# Patient Record
Sex: Male | Born: 1935 | Race: White | Hispanic: No | Marital: Married | Smoking: Former smoker
Health system: Southern US, Community
[De-identification: ages and names within clinical notes are randomized; demographics above are authoritative.]

## PROBLEM LIST (undated history)

## (undated) DIAGNOSIS — I82409 Acute embolism and thrombosis of unspecified deep veins of unspecified lower extremity: Secondary | ICD-10-CM

## (undated) DIAGNOSIS — I10 Essential (primary) hypertension: Secondary | ICD-10-CM

## (undated) DIAGNOSIS — E785 Hyperlipidemia, unspecified: Secondary | ICD-10-CM

## (undated) DIAGNOSIS — M1990 Unspecified osteoarthritis, unspecified site: Secondary | ICD-10-CM

## (undated) DIAGNOSIS — E119 Type 2 diabetes mellitus without complications: Secondary | ICD-10-CM

## (undated) DIAGNOSIS — I350 Nonrheumatic aortic (valve) stenosis: Secondary | ICD-10-CM

## (undated) DIAGNOSIS — I2510 Atherosclerotic heart disease of native coronary artery without angina pectoris: Secondary | ICD-10-CM

## (undated) DIAGNOSIS — I255 Ischemic cardiomyopathy: Secondary | ICD-10-CM

## (undated) DIAGNOSIS — I340 Nonrheumatic mitral (valve) insufficiency: Secondary | ICD-10-CM

## (undated) DIAGNOSIS — K219 Gastro-esophageal reflux disease without esophagitis: Secondary | ICD-10-CM

## (undated) HISTORY — DX: Essential (primary) hypertension: I10

## (undated) HISTORY — PX: COSMETIC SURGERY: SHX468

## (undated) HISTORY — PX: LAPAROSCOPIC CHOLECYSTECTOMY: SUR755

## (undated) HISTORY — PX: CORONARY ANGIOPLASTY: SHX604

## (undated) HISTORY — PX: ACHILLES TENDON REPAIR: SUR1153

## (undated) HISTORY — PX: TONSILLECTOMY: SUR1361

## (undated) HISTORY — DX: Hyperlipidemia, unspecified: E78.5

## (undated) HISTORY — PX: CARPAL TUNNEL RELEASE: SHX101

---

## 2001-06-18 ENCOUNTER — Other Ambulatory Visit: Admission: RE | Admit: 2001-06-18 | Discharge: 2001-06-18 | Payer: Self-pay | Admitting: Dermatology

## 2013-06-11 ENCOUNTER — Other Ambulatory Visit (HOSPITAL_COMMUNITY): Payer: Self-pay

## 2013-06-25 ENCOUNTER — Encounter (HOSPITAL_COMMUNITY)
Admission: RE | Admit: 2013-06-25 | Discharge: 2013-06-25 | Disposition: A | Discharge status: Home / Self Care | Payer: Medicare Other | Source: Ambulatory Visit | Attending: Ophthalmology | Admitting: Ophthalmology

## 2013-06-25 ENCOUNTER — Encounter (HOSPITAL_COMMUNITY): Payer: Self-pay

## 2013-06-25 ENCOUNTER — Other Ambulatory Visit: Payer: Self-pay

## 2013-06-25 ENCOUNTER — Encounter (HOSPITAL_COMMUNITY): Payer: Self-pay | Admitting: Pharmacy Technician

## 2013-06-25 DIAGNOSIS — Z01810 Encounter for preprocedural cardiovascular examination: Secondary | ICD-10-CM | POA: Insufficient documentation

## 2013-06-25 DIAGNOSIS — Z01812 Encounter for preprocedural laboratory examination: Secondary | ICD-10-CM | POA: Insufficient documentation

## 2013-06-25 HISTORY — DX: Unspecified osteoarthritis, unspecified site: M19.90

## 2013-06-25 HISTORY — DX: Gastro-esophageal reflux disease without esophagitis: K21.9

## 2013-06-25 LAB — HEMOGLOBIN AND HEMATOCRIT, BLOOD
HCT: 38.1 % — ABNORMAL LOW (ref 39.0–52.0)
Hemoglobin: 13 g/dL (ref 13.0–17.0)

## 2013-06-25 LAB — BASIC METABOLIC PANEL
CO2: 27 mEq/L (ref 19–32)
GFR calc non Af Amer: 33 mL/min — ABNORMAL LOW (ref >90)
Glucose, Bld: 132 mg/dL — ABNORMAL HIGH (ref 70–99)
Potassium: 4.2 mEq/L (ref 3.5–5.1)
Sodium: 140 mEq/L (ref 135–145)

## 2013-06-25 NOTE — Patient Instructions (Addendum)
Your procedure is scheduled on:    Report to [facility] at        AM.  Call this number if you have problems the morning of surgery: [phone]   Remember:   Do not eat or drink   After Midnight.  Take these medicines the morning of surgery with A SIP OF WATER: terazosin, lisinopril   Do not wear jewelry, make-up or nail polish.  Do not wear lotions, powders, or perfumes. You may wear deodorant.  Do not bring valuables to the hospital.  Contacts, dentures or bridgework may not be worn into surgery.     Patients discharged the day of surgery will not be allowed to drive home.  Name and phone number of your driver: driver  Special Instructions: Use eye drops as directed.   Please read over the following fact sheets that you were given: Pain Booklet, Anesthesia Post-op Instructions and Care and Recovery After Surgery    Cataract Surgery  A cataract is a clouding of the lens of the eye. When a lens becomes cloudy, vision is reduced based on the degree and nature of the clouding. Surgery may be needed to improve vision. Surgery removes the cloudy lens and usually replaces it with a substitute lens (intraocular lens, IOL). LET YOUR EYE DOCTOR KNOW ABOUT:  Allergies to food or medicine.   Medicines taken including herbs, eyedrops, over-the-counter medicines, and creams.   Use of steroids (by mouth or creams).   Previous problems with anesthetics or numbing medicine.   History of bleeding problems or blood clots.   Previous surgery.   Other health problems, including diabetes and kidney problems.   Possibility of pregnancy, if this applies.  RISKS AND COMPLICATIONS  Infection.   Inflammation of the eyeball (endophthalmitis) that can spread to both eyes (sympathetic ophthalmia).   Poor wound healing.   If an IOL is inserted, it can later fall out of proper position. This is very uncommon.   Clouding of the part of your eye that holds an IOL in place. This is called an  "after-cataract." These are uncommon, but easily treated.  BEFORE THE PROCEDURE  Do not eat or drink anything except small amounts of water for 8 to 12 before your surgery, or as directed by your caregiver.   Unless you are told otherwise, continue any eyedrops you have been prescribed.   Talk to your primary caregiver about all other medicines that you take (both prescription and non-prescription). In some cases, you may need to stop or change medicines near the time of your surgery. This is most important if you are taking blood-thinning medicine.Do not stop medicines unless you are told to do so.   Arrange for someone to drive you to and from the procedure.   Do not put contact lenses in either eye on the day of your surgery.  PROCEDURE There is more than one method for safely removing a cataract. Your doctor can explain the differences and help determine which is best for you. Phacoemulsification surgery is the most common form of cataract surgery.  An injection is given behind the eye or eyedrops are given to make this a painless procedure.   A small cut (incision) is made on the edge of the clear, dome-shaped surface that covers the front of the eye (cornea).   A tiny probe is painlessly inserted into the eye. This device gives off ultrasound waves that soften and break up the cloudy center of the lens. This makes it  easier for the cloudy lens to be removed by suction.   An IOL may be implanted.   The normal lens of the eye is covered by a clear capsule. Part of that capsule is intentionally left in the eye to support the IOL.   Your surgeon may or may not use stitches to close the incision.  There are other forms of cataract surgery that require a larger incision and stiches to close the eye. This approach is taken in cases where the doctor feels that the cataract cannot be easily removed using phacoemulsification. AFTER THE PROCEDURE  When an IOL is implanted, it does not need  care. It becomes a permanent part of your eye and cannot be seen or felt.   Your doctor will schedule follow-up exams to check on your progress.   Review your other medicines with your doctor to see which can be resumed after surgery.   Use eyedrops or take medicine as prescribed by your doctor.  Document Released: 11/10/2011 Document Reviewed: 11/07/2011 [facility] Patient Information 2012 [place].  PATIENT INSTRUCTIONS POST-ANESTHESIA  IMMEDIATELY FOLLOWING SURGERY:  Do not drive or operate machinery for the first twenty four hours after surgery.  Do not make any important decisions for twenty four hours after surgery or while taking narcotic pain medications or sedatives.  If you develop intractable nausea and vomiting or a severe headache please notify your doctor immediately.  FOLLOW-UP:  Please make an appointment with your surgeon as instructed. You do not need to follow up with anesthesia unless specifically instructed to do so.  WOUND CARE INSTRUCTIONS (if applicable):  Keep a dry clean dressing on the anesthesia/puncture wound site if there is drainage.  Once the wound has quit draining you may leave it open to air.  Generally you should leave the bandage intact for twenty four hours unless there is drainage.  If the epidural site drains for more than 36-48 hours please call the anesthesia department.  QUESTIONS?:  Please feel free to call your physician or the hospital operator if you have any questions, and they will be happy to assist you.

## 2013-06-25 NOTE — Progress Notes (Signed)
06/25/13 1121  OBSTRUCTIVE SLEEP APNEA  Have you ever been diagnosed with sleep apnea through a sleep study? No  Do you snore loudly (loud enough to be heard through closed doors)?  0  Do you often feel tired, fatigued, or sleepy during the daytime? 1  Has anyone observed you stop breathing during your sleep? 0  Do you have, or are you being treated for high blood pressure? 1  BMI more than 35 kg/m2? 0  Age over 77 years old? 1  Neck circumference greater than 40 cm/18 inches? 1  Gender: 1  Obstructive Sleep Apnea Score 5  Score 4 or greater  Results sent to PCP;No PCP

## 2013-06-28 MED ORDER — TETRACAINE HCL 0.5 % OP SOLN
OPHTHALMIC | Status: AC
  Filled 2013-06-28: qty 2

## 2013-06-28 MED ORDER — LIDOCAINE HCL (PF) 1 % IJ SOLN
INTRAMUSCULAR | Status: AC
  Filled 2013-06-28: qty 2

## 2013-06-28 MED ORDER — LIDOCAINE HCL 3.5 % OP GEL
OPHTHALMIC | Status: AC
  Filled 2013-06-28: qty 5

## 2013-06-28 MED ORDER — CYCLOPENTOLATE-PHENYLEPHRINE 0.2-1 % OP SOLN
OPHTHALMIC | Status: AC
  Filled 2013-06-28: qty 2

## 2013-06-28 MED ORDER — NEOMYCIN-POLYMYXIN-DEXAMETH 3.5-10000-0.1 OP OINT
TOPICAL_OINTMENT | OPHTHALMIC | Status: AC
  Filled 2013-06-28: qty 3.5

## 2013-06-28 NOTE — OR Nursing (Signed)
Abnormal ekg reported to Dr. Jayme Cloud.

## 2013-07-01 ENCOUNTER — Ambulatory Visit (HOSPITAL_COMMUNITY)
Admission: RE | Admit: 2013-07-01 | Discharge: 2013-07-01 | Disposition: A | Discharge status: Home / Self Care | Payer: Medicare Other | Source: Ambulatory Visit | Attending: Ophthalmology | Admitting: Ophthalmology

## 2013-07-01 ENCOUNTER — Ambulatory Visit (HOSPITAL_COMMUNITY): Payer: Medicare Other | Admitting: Anesthesiology

## 2013-07-01 ENCOUNTER — Encounter (HOSPITAL_COMMUNITY): Payer: Self-pay | Admitting: Anesthesiology

## 2013-07-01 ENCOUNTER — Encounter (HOSPITAL_COMMUNITY): Payer: Self-pay | Admitting: *Deleted

## 2013-07-01 ENCOUNTER — Encounter (HOSPITAL_COMMUNITY)
Admission: RE | Disposition: A | Discharge status: Home / Self Care | Payer: Self-pay | Source: Ambulatory Visit | Attending: Ophthalmology

## 2013-07-01 DIAGNOSIS — E119 Type 2 diabetes mellitus without complications: Secondary | ICD-10-CM | POA: Insufficient documentation

## 2013-07-01 DIAGNOSIS — I10 Essential (primary) hypertension: Secondary | ICD-10-CM | POA: Insufficient documentation

## 2013-07-01 DIAGNOSIS — H2589 Other age-related cataract: Secondary | ICD-10-CM | POA: Insufficient documentation

## 2013-07-01 HISTORY — PX: CATARACT EXTRACTION W/PHACO: SHX586

## 2013-07-01 LAB — GLUCOSE, CAPILLARY: Glucose-Capillary: 96 mg/dL (ref 70–99)

## 2013-07-01 SURGERY — PHACOEMULSIFICATION, CATARACT, WITH IOL INSERTION
Anesthesia: Monitor Anesthesia Care | Site: Eye | Laterality: Right | Wound class: Clean

## 2013-07-01 MED ORDER — TETRACAINE HCL 0.5 % OP SOLN
1.0000 [drp] | OPHTHALMIC | Status: AC
  Administered 2013-07-01 (×3): 1 [drp] via OPHTHALMIC

## 2013-07-01 MED ORDER — POVIDONE-IODINE 5 % OP SOLN
OPHTHALMIC | Status: DC | PRN
  Administered 2013-07-01: 1 via OPHTHALMIC

## 2013-07-01 MED ORDER — PROVISC 10 MG/ML IO SOLN
INTRAOCULAR | Status: DC | PRN
  Administered 2013-07-01: 8.5 mg via INTRAOCULAR

## 2013-07-01 MED ORDER — LIDOCAINE HCL 3.5 % OP GEL
1.0000 "application " | Freq: Once | OPHTHALMIC | Status: AC
  Administered 2013-07-01: 1 via OPHTHALMIC

## 2013-07-01 MED ORDER — NEOMYCIN-POLYMYXIN-DEXAMETH 0.1 % OP OINT
TOPICAL_OINTMENT | OPHTHALMIC | Status: DC | PRN
  Administered 2013-07-01: 1 via OPHTHALMIC

## 2013-07-01 MED ORDER — MIDAZOLAM HCL 2 MG/2ML IJ SOLN
1.0000 mg | INTRAMUSCULAR | Status: DC | PRN
  Administered 2013-07-01: 2 mg via INTRAVENOUS

## 2013-07-01 MED ORDER — CYCLOPENTOLATE-PHENYLEPHRINE 0.2-1 % OP SOLN
1.0000 [drp] | OPHTHALMIC | Status: AC
  Administered 2013-07-01 (×3): 1 [drp] via OPHTHALMIC

## 2013-07-01 MED ORDER — EPINEPHRINE HCL 1 MG/ML IJ SOLN
INTRAOCULAR | Status: DC | PRN
  Administered 2013-07-01: 08:00:00

## 2013-07-01 MED ORDER — MIDAZOLAM HCL 2 MG/2ML IJ SOLN
INTRAMUSCULAR | Status: AC
  Filled 2013-07-01: qty 2

## 2013-07-01 MED ORDER — LIDOCAINE HCL (PF) 1 % IJ SOLN
INTRAMUSCULAR | Status: DC | PRN
  Administered 2013-07-01: .6 mL

## 2013-07-01 MED ORDER — LACTATED RINGERS IV SOLN
INTRAVENOUS | Status: DC | PRN
  Administered 2013-07-01: 07:00:00 via INTRAVENOUS

## 2013-07-01 MED ORDER — PHENYLEPHRINE HCL 2.5 % OP SOLN
1.0000 [drp] | OPHTHALMIC | Status: AC
  Administered 2013-07-01 (×3): 1 [drp] via OPHTHALMIC

## 2013-07-01 MED ORDER — BSS IO SOLN
INTRAOCULAR | Status: DC | PRN
  Administered 2013-07-01: 15 mL via INTRAOCULAR

## 2013-07-01 MED ORDER — LACTATED RINGERS IV SOLN
INTRAVENOUS | Status: DC
  Administered 2013-07-01: 07:00:00 via INTRAVENOUS

## 2013-07-01 SURGICAL SUPPLY — 32 items

## 2013-07-01 NOTE — Anesthesia Postprocedure Evaluation (Signed)
  Anesthesia Post-op Note  Patient: Tim Hopkins.  Procedure(s) Performed: Procedure(s) with comments: CATARACT EXTRACTION PHACO AND INTRAOCULAR LENS PLACEMENT (IOC) (Right) - CDE 17.35  Patient Location: Short Stay  Anesthesia Type:MAC  Level of Consciousness: awake, alert , oriented and patient cooperative  Airway and Oxygen Therapy: Patient Spontanous Breathing  Post-op Pain: none  Post-op Assessment: Post-op Vital signs reviewed, Patient's Cardiovascular Status Stable, Respiratory Function Stable, Patent Airway and No signs of Nausea or vomiting  Post-op Vital Signs: Reviewed and stable  Complications: No apparent anesthesia complications

## 2013-07-01 NOTE — H&P (Signed)
I have reviewed the H&P, the patient was re-examined, and I have identified no interval changes in medical condition and plan of care since the history and physical of record  

## 2013-07-01 NOTE — Anesthesia Procedure Notes (Signed)
Procedure Name: MAC Date/Time: 07/01/2013 7:32 AM Performed by: Carolyne Littles, Anhar Mcdermott L Pre-anesthesia Checklist: Timeout performed, Patient identified, Emergency Drugs available, Suction available and Patient being monitored Oxygen Delivery Method: Nasal cannula

## 2013-07-01 NOTE — Preoperative (Signed)
Beta Blockers   Reason not to administer Beta Blockers:Not Applicable 

## 2013-07-01 NOTE — Discharge Instructions (Signed)
[name].  07/01/2013     Instructions  1. Use medications as Instructed.  Shake well before use. Wait 5 minutes between drops.  {OPHTHALMIC ANTIBIOTICS:22167} 4 times a day x 1 week.  {OPHTHALMIC ANTI-INFLAMMATORY:22168} 2 times a day x 4 weeks.  {OPHTHALMIC STEROID:22169} 4 times a day - week 1   3 times a day - Week 2, 2 times a day- Week 3, 1 time a day - Week 4.  2. Do not rub the operative eye. Do not swim underwater for 2 weeks.  3. You may remove the clear shield and resume your normal activities the day after  Surgery. Your eyes may feel more comfortable if you wear dark glasses outside.  4. Call our office at [phone] if you have sudden change in vision, extreme redness or pain. Some fluctuation in vision is normal after surgery. If you have an emergency after hours, call Dr. Alto Denver at [phone].  5. It is important that you attend all of your follow-up appointments.             Dr. Alto Denver: [phone]   If you find that you cannot contact your physician, but feel that your signs and   Symptoms warrant a physician's attention, call the Emergency Room at   [phone].

## 2013-07-01 NOTE — Op Note (Signed)
Date of Admission: 07/01/2013  Date of Surgery: 07/01/2013  Pre-Op Dx: Cataract  Right  Eye  Post-Op Dx: Cataract  Right  Eye,  Dx Code 366.19  Surgeon: Gemma Payor, M.D.  Assistants: None  Anesthesia: Topical with MAC  Indications: Painless, progressive loss of vision with compromise of daily activities.  Surgery: Cataract Extraction with Intraocular lens Implant Right Eye  Discription: The patient had dilating drops and viscous lidocaine placed into the left eye in the pre-op holding area. After transfer to the operating room, a time out was performed. The patient was then prepped and draped. Beginning with a 75 degree blade a paracentesis port was made at the surgeon's 2 o'clock position. The anterior chamber was then filled with 1% non-preserved lidocaine. This was followed by filling the anterior chamber with Provisc. A bent cystatome needle was used to create a continuous tear capsulotomy. Hydrodissection was performed with balanced salt solution on a Fine canula. The lens nucleus was then removed using the phacoemulsification handpiece. Residual cortex was removed with the I&A handpiece. The anterior chamber and capsular bag were refilled with Provisc. A posterior chamber intraocular lens was placed into the capsular bag with it's injector. The implant was positioned with the Kuglan hook. The Provisc was then removed from the anterior chamber and capsular bag with the I&A handpiece. Stromal hydration of the main incision and paracentesis port was performed with BSS on a Fine canula. The wounds were tested for leak which was negative. The patient tolerated the procedure well. There were no operative complications. The patient was then transferred to the recovery room in stable condition.  Complications: None  Specimen: None  EBL: None  Prosthetic device: B&L enVista, MX60, power 21.0D, SN [id number].

## 2013-07-01 NOTE — Transfer of Care (Signed)
Immediate Anesthesia Transfer of Care Note  Patient: Tim Hopkins.  Procedure(s) Performed: Procedure(s) with comments: CATARACT EXTRACTION PHACO AND INTRAOCULAR LENS PLACEMENT (IOC) (Right) - CDE 17.35  Patient Location: Short Stay  Anesthesia Type:MAC  Level of Consciousness: awake, alert , oriented and patient cooperative  Airway & Oxygen Therapy: Patient Spontanous Breathing  Post-op Assessment: Report given to PACU RN and Post -op Vital signs reviewed and stable  Post vital signs: Reviewed and stable  Complications: No apparent anesthesia complications

## 2013-07-01 NOTE — Anesthesia Preprocedure Evaluation (Signed)
Anesthesia Evaluation  Patient identified by MRN, date of birth, ID band Patient awake    Reviewed: Allergy & Precautions, H&P , NPO status , Patient's Chart, lab work & pertinent test results  Airway Mallampati: II TM Distance: >3 FB Neck ROM: Full    Dental  (+) Teeth Intact and Poor Dentition   Pulmonary  breath sounds clear to auscultation        Cardiovascular hypertension, Pt. on medications Rhythm:Regular Rate:Normal     Neuro/Psych negative neurological ROS     GI/Hepatic GERD-  Medicated,  Endo/Other  diabetes, Well Controlled, Type 2, Oral Hypoglycemic Agents  Renal/GU      Musculoskeletal   Abdominal   Peds  Hematology   Anesthesia Other Findings   Reproductive/Obstetrics                           Anesthesia Physical Anesthesia Plan  ASA: III  Anesthesia Plan: MAC   Post-op Pain Management:    Induction: Intravenous  Airway Management Planned: Nasal Cannula  Additional Equipment:   Intra-op Plan:   Post-operative Plan:   Informed Consent: I have reviewed the patients History and Physical, chart, labs and discussed the procedure including the risks, benefits and alternatives for the proposed anesthesia with the patient or authorized representative who has indicated his/her understanding and acceptance.     Plan Discussed with:   Anesthesia Plan Comments:         Anesthesia Quick Evaluation

## 2013-07-02 ENCOUNTER — Encounter (HOSPITAL_COMMUNITY): Payer: Self-pay | Admitting: Ophthalmology

## 2013-07-08 ENCOUNTER — Encounter (HOSPITAL_COMMUNITY): Payer: Self-pay | Admitting: Pharmacy Technician

## 2013-07-09 ENCOUNTER — Encounter (HOSPITAL_COMMUNITY)
Admission: RE | Admit: 2013-07-09 | Discharge: 2013-07-09 | Disposition: A | Discharge status: Home / Self Care | Payer: Medicare Other | Source: Ambulatory Visit | Attending: Ophthalmology | Admitting: Ophthalmology

## 2013-07-09 ENCOUNTER — Encounter (HOSPITAL_COMMUNITY): Payer: Self-pay

## 2013-07-09 MED ORDER — ONDANSETRON HCL 4 MG/2ML IJ SOLN: 4.0000 mg | Freq: Once | INTRAMUSCULAR | Status: AC | PRN

## 2013-07-09 MED ORDER — FENTANYL CITRATE 0.05 MG/ML IJ SOLN: 25.0000 ug | INTRAMUSCULAR | Status: DC | PRN

## 2013-07-12 MED ORDER — NEOMYCIN-POLYMYXIN-DEXAMETH 3.5-10000-0.1 OP OINT
TOPICAL_OINTMENT | OPHTHALMIC | Status: AC
  Filled 2013-07-12: qty 3.5

## 2013-07-12 MED ORDER — PHENYLEPHRINE HCL 2.5 % OP SOLN
OPHTHALMIC | Status: AC
  Filled 2013-07-12: qty 2

## 2013-07-12 MED ORDER — CYCLOPENTOLATE-PHENYLEPHRINE 0.2-1 % OP SOLN
OPHTHALMIC | Status: AC
  Filled 2013-07-12: qty 2

## 2013-07-12 MED ORDER — TETRACAINE HCL 0.5 % OP SOLN
OPHTHALMIC | Status: AC
  Filled 2013-07-12: qty 2

## 2013-07-12 MED ORDER — LIDOCAINE HCL 3.5 % OP GEL
OPHTHALMIC | Status: AC
  Filled 2013-07-12: qty 5

## 2013-07-12 MED ORDER — LIDOCAINE HCL (PF) 1 % IJ SOLN
INTRAMUSCULAR | Status: AC
  Filled 2013-07-12: qty 2

## 2013-07-15 ENCOUNTER — Encounter (HOSPITAL_COMMUNITY)
Admission: RE | Disposition: A | Discharge status: Home / Self Care | Payer: Self-pay | Source: Ambulatory Visit | Attending: Ophthalmology

## 2013-07-15 ENCOUNTER — Ambulatory Visit (HOSPITAL_COMMUNITY)
Admission: RE | Admit: 2013-07-15 | Discharge: 2013-07-15 | Disposition: A | Discharge status: Home / Self Care | Payer: Medicare Other | Source: Ambulatory Visit | Attending: Ophthalmology | Admitting: Ophthalmology

## 2013-07-15 ENCOUNTER — Encounter (HOSPITAL_COMMUNITY): Payer: Self-pay | Admitting: Anesthesiology

## 2013-07-15 ENCOUNTER — Encounter (HOSPITAL_COMMUNITY): Payer: Self-pay | Admitting: *Deleted

## 2013-07-15 ENCOUNTER — Ambulatory Visit (HOSPITAL_COMMUNITY): Payer: Medicare Other | Admitting: Anesthesiology

## 2013-07-15 DIAGNOSIS — Z01812 Encounter for preprocedural laboratory examination: Secondary | ICD-10-CM | POA: Insufficient documentation

## 2013-07-15 DIAGNOSIS — I10 Essential (primary) hypertension: Secondary | ICD-10-CM | POA: Insufficient documentation

## 2013-07-15 DIAGNOSIS — E119 Type 2 diabetes mellitus without complications: Secondary | ICD-10-CM | POA: Insufficient documentation

## 2013-07-15 DIAGNOSIS — H2510 Age-related nuclear cataract, unspecified eye: Secondary | ICD-10-CM | POA: Insufficient documentation

## 2013-07-15 HISTORY — PX: CATARACT EXTRACTION W/PHACO: SHX586

## 2013-07-15 LAB — GLUCOSE, CAPILLARY: Glucose-Capillary: 93 mg/dL (ref 70–99)

## 2013-07-15 SURGERY — PHACOEMULSIFICATION, CATARACT, WITH IOL INSERTION
Anesthesia: Monitor Anesthesia Care | Site: Eye | Laterality: Left | Wound class: Clean

## 2013-07-15 MED ORDER — CYCLOPENTOLATE-PHENYLEPHRINE 0.2-1 % OP SOLN
1.0000 [drp] | OPHTHALMIC | Status: AC
Start: 2013-07-15 — End: 2013-07-15
  Administered 2013-07-15 (×3): 1 [drp] via OPHTHALMIC

## 2013-07-15 MED ORDER — POVIDONE-IODINE 5 % OP SOLN
OPHTHALMIC | Status: DC | PRN
  Administered 2013-07-15: 1 via OPHTHALMIC

## 2013-07-15 MED ORDER — BSS IO SOLN
INTRAOCULAR | Status: DC | PRN
  Administered 2013-07-15: 15 mL via INTRAOCULAR

## 2013-07-15 MED ORDER — MIDAZOLAM HCL 2 MG/2ML IJ SOLN
INTRAMUSCULAR | Status: AC
  Filled 2013-07-15: qty 2

## 2013-07-15 MED ORDER — TETRACAINE HCL 0.5 % OP SOLN
1.0000 [drp] | OPHTHALMIC | Status: AC
Start: 2013-07-15 — End: 2013-07-15
  Administered 2013-07-15 (×3): 1 [drp] via OPHTHALMIC

## 2013-07-15 MED ORDER — LIDOCAINE HCL (PF) 1 % IJ SOLN
INTRAMUSCULAR | Status: DC | PRN
  Administered 2013-07-15: .5 mL

## 2013-07-15 MED ORDER — NEOMYCIN-POLYMYXIN-DEXAMETH 0.1 % OP OINT
TOPICAL_OINTMENT | OPHTHALMIC | Status: DC | PRN
  Administered 2013-07-15: 1 via OPHTHALMIC

## 2013-07-15 MED ORDER — PROVISC 10 MG/ML IO SOLN
INTRAOCULAR | Status: DC | PRN
  Administered 2013-07-15: 8.5 mg via INTRAOCULAR

## 2013-07-15 MED ORDER — LACTATED RINGERS IV SOLN
INTRAVENOUS | Status: DC
  Administered 2013-07-15: 1000 mL via INTRAVENOUS

## 2013-07-15 MED ORDER — EPINEPHRINE HCL 1 MG/ML IJ SOLN
INTRAMUSCULAR | Status: AC
  Filled 2013-07-15: qty 1

## 2013-07-15 MED ORDER — EPINEPHRINE HCL 1 MG/ML IJ SOLN
INTRAMUSCULAR | Status: DC | PRN
  Administered 2013-07-15: 09:00:00

## 2013-07-15 MED ORDER — LIDOCAINE HCL 3.5 % OP GEL
1.0000 "application " | Freq: Once | OPHTHALMIC | Status: AC
  Administered 2013-07-15: 1 via OPHTHALMIC

## 2013-07-15 MED ORDER — MIDAZOLAM HCL 2 MG/2ML IJ SOLN
1.0000 mg | INTRAMUSCULAR | Status: DC | PRN
  Administered 2013-07-15: 2 mg via INTRAVENOUS

## 2013-07-15 MED ORDER — PHENYLEPHRINE HCL 2.5 % OP SOLN
1.0000 [drp] | OPHTHALMIC | Status: AC
Start: 2013-07-15 — End: 2013-07-15
  Administered 2013-07-15 (×3): 1 [drp] via OPHTHALMIC

## 2013-07-15 SURGICAL SUPPLY — 32 items

## 2013-07-15 NOTE — Anesthesia Preprocedure Evaluation (Signed)
Anesthesia Evaluation  Patient identified by MRN, date of birth, ID band Patient awake    Reviewed: Allergy & Precautions, H&P , NPO status , Patient's Chart, lab work & pertinent test results  Airway Mallampati: II TM Distance: >3 FB Neck ROM: Full    Dental  (+) Teeth Intact and Poor Dentition   Pulmonary  breath sounds clear to auscultation        Cardiovascular hypertension, Pt. on medications Rhythm:Regular Rate:Normal     Neuro/Psych negative neurological ROS     GI/Hepatic GERD-  Medicated,  Endo/Other  diabetes, Well Controlled, Type 2, Oral Hypoglycemic Agents  Renal/GU      Musculoskeletal   Abdominal   Peds  Hematology   Anesthesia Other Findings   Reproductive/Obstetrics                           Anesthesia Physical Anesthesia Plan  ASA: III  Anesthesia Plan: MAC   Post-op Pain Management:    Induction: Intravenous  Airway Management Planned: Nasal Cannula  Additional Equipment:   Intra-op Plan:   Post-operative Plan:   Informed Consent: I have reviewed the patients History and Physical, chart, labs and discussed the procedure including the risks, benefits and alternatives for the proposed anesthesia with the patient or authorized representative who has indicated his/her understanding and acceptance.     Plan Discussed with:   Anesthesia Plan Comments:         Anesthesia Quick Evaluation  

## 2013-07-15 NOTE — Transfer of Care (Signed)
Immediate Anesthesia Transfer of Care Note  Patient: Tim Hopkins.  Procedure(s) Performed: Procedure(s) with comments: CATARACT EXTRACTION PHACO AND INTRAOCULAR LENS PLACEMENT (IOC) (Left) - CDE:12.58  Patient Location: Short Stay  Anesthesia Type:MAC  Level of Consciousness: awake, alert , oriented and patient cooperative  Airway & Oxygen Therapy: Patient Spontanous Breathing  Post-op Assessment: Report given to PACU RN, Post -op Vital signs reviewed and stable and Patient moving all extremities  Post vital signs: Reviewed and stable  Complications: No apparent anesthesia complications

## 2013-07-15 NOTE — Op Note (Signed)
Date of Admission: 07/15/2013  Date of Surgery: 07/15/2013  Pre-Op Dx: Cataract  Left  Eye  Post-Op Dx: Cataract  Left  Eye,  Dx Code 366.16  Surgeon: Gemma Payor, M.D.  Assistants: None  Anesthesia: Topical with MAC  Indications: Painless, progressive loss of vision with compromise of daily activities.  Surgery: Cataract Extraction with Intraocular lens Implant Left Eye  Discription: The patient had dilating drops and viscous lidocaine placed into the left eye in the pre-op holding area. After transfer to the operating room, a time out was performed. The patient was then prepped and draped. Beginning with a 75 degree blade a paracentesis port was made at the surgeon's 2 o'clock position. The anterior chamber was then filled with 1% non-preserved lidocaine. This was followed by filling the anterior chamber with Provisc. A bent cystatome needle was used to create a continuous tear capsulotomy. Hydrodissection was performed with balanced salt solution on a Fine canula. The lens nucleus was then removed using the phacoemulsification handpiece. Residual cortex was removed with the I&A handpiece. The anterior chamber and capsular bag were refilled with Provisc. A posterior chamber intraocular lens was placed into the capsular bag with it's injector. The implant was positioned with the Kuglan hook. The Provisc was then removed from the anterior chamber and capsular bag with the I&A handpiece. Stromal hydration of the main incision and paracentesis port was performed with BSS on a Fine canula. The wounds were tested for leak which was negative. The patient tolerated the procedure well. There were no operative complications. The patient was then transferred to the recovery room in stable condition.  Complications: None  Specimen: None  EBL: None  Prosthetic device: B&L enVista, MX60, power 20.0D, SN [id number].

## 2013-07-15 NOTE — H&P (Signed)
I have reviewed the H&P, the patient was re-examined, and I have identified no interval changes in medical condition and plan of care since the history and physical of record  

## 2013-07-15 NOTE — Anesthesia Postprocedure Evaluation (Signed)
  Anesthesia Post-op Note  Patient: Tim Hopkins.  Procedure(s) Performed: Procedure(s) with comments: CATARACT EXTRACTION PHACO AND INTRAOCULAR LENS PLACEMENT (IOC) (Left) - CDE:12.58  Patient Location: Short Stay  Anesthesia Type:MAC  Level of Consciousness: awake, alert , oriented and patient cooperative  Airway and Oxygen Therapy: Patient Spontanous Breathing  Post-op Pain: none  Post-op Assessment: Post-op Vital signs reviewed, Patient's Cardiovascular Status Stable, Respiratory Function Stable, Patent Airway and Pain level controlled  Post-op Vital Signs: Reviewed and stable  Complications: No apparent anesthesia complications

## 2013-07-18 ENCOUNTER — Encounter (HOSPITAL_COMMUNITY): Payer: Self-pay | Admitting: Ophthalmology

## 2016-08-19 ENCOUNTER — Inpatient Hospital Stay (HOSPITAL_COMMUNITY)
Admission: EM | Admit: 2016-08-19 | Discharge: 2016-08-30 | DRG: 216 | Disposition: A | Discharge status: Skilled Nursing Facility | Payer: Medicare Other | Attending: Internal Medicine | Admitting: Internal Medicine

## 2016-08-19 ENCOUNTER — Emergency Department (HOSPITAL_COMMUNITY): Payer: Medicare Other

## 2016-08-19 ENCOUNTER — Encounter (HOSPITAL_COMMUNITY): Payer: Self-pay | Admitting: *Deleted

## 2016-08-19 DIAGNOSIS — Z9841 Cataract extraction status, right eye: Secondary | ICD-10-CM

## 2016-08-19 DIAGNOSIS — J90 Pleural effusion, not elsewhere classified: Secondary | ICD-10-CM | POA: Insufficient documentation

## 2016-08-19 DIAGNOSIS — Z79899 Other long term (current) drug therapy: Secondary | ICD-10-CM | POA: Diagnosis not present

## 2016-08-19 DIAGNOSIS — R7989 Other specified abnormal findings of blood chemistry: Secondary | ICD-10-CM

## 2016-08-19 DIAGNOSIS — I509 Heart failure, unspecified: Secondary | ICD-10-CM

## 2016-08-19 DIAGNOSIS — I82492 Acute embolism and thrombosis of other specified deep vein of left lower extremity: Secondary | ICD-10-CM | POA: Diagnosis not present

## 2016-08-19 DIAGNOSIS — I25110 Atherosclerotic heart disease of native coronary artery with unstable angina pectoris: Secondary | ICD-10-CM | POA: Diagnosis not present

## 2016-08-19 DIAGNOSIS — E1122 Type 2 diabetes mellitus with diabetic chronic kidney disease: Secondary | ICD-10-CM | POA: Diagnosis present

## 2016-08-19 DIAGNOSIS — I209 Angina pectoris, unspecified: Secondary | ICD-10-CM | POA: Diagnosis not present

## 2016-08-19 DIAGNOSIS — E119 Type 2 diabetes mellitus without complications: Secondary | ICD-10-CM | POA: Diagnosis present

## 2016-08-19 DIAGNOSIS — M1990 Unspecified osteoarthritis, unspecified site: Secondary | ICD-10-CM | POA: Diagnosis not present

## 2016-08-19 DIAGNOSIS — Z9842 Cataract extraction status, left eye: Secondary | ICD-10-CM

## 2016-08-19 DIAGNOSIS — R0789 Other chest pain: Secondary | ICD-10-CM | POA: Diagnosis not present

## 2016-08-19 DIAGNOSIS — K219 Gastro-esophageal reflux disease without esophagitis: Secondary | ICD-10-CM | POA: Diagnosis present

## 2016-08-19 DIAGNOSIS — I5021 Acute systolic (congestive) heart failure: Secondary | ICD-10-CM | POA: Diagnosis present

## 2016-08-19 DIAGNOSIS — M79605 Pain in left leg: Secondary | ICD-10-CM | POA: Diagnosis not present

## 2016-08-19 DIAGNOSIS — D649 Anemia, unspecified: Secondary | ICD-10-CM | POA: Diagnosis present

## 2016-08-19 DIAGNOSIS — R072 Precordial pain: Secondary | ICD-10-CM | POA: Diagnosis not present

## 2016-08-19 DIAGNOSIS — I255 Ischemic cardiomyopathy: Secondary | ICD-10-CM | POA: Diagnosis present

## 2016-08-19 DIAGNOSIS — N289 Disorder of kidney and ureter, unspecified: Secondary | ICD-10-CM

## 2016-08-19 DIAGNOSIS — Z86718 Personal history of other venous thrombosis and embolism: Secondary | ICD-10-CM

## 2016-08-19 DIAGNOSIS — I130 Hypertensive heart and chronic kidney disease with heart failure and stage 1 through stage 4 chronic kidney disease, or unspecified chronic kidney disease: Secondary | ICD-10-CM | POA: Diagnosis present

## 2016-08-19 DIAGNOSIS — Z87891 Personal history of nicotine dependence: Secondary | ICD-10-CM

## 2016-08-19 DIAGNOSIS — N183 Chronic kidney disease, stage 3 unspecified: Secondary | ICD-10-CM | POA: Diagnosis present

## 2016-08-19 DIAGNOSIS — I2510 Atherosclerotic heart disease of native coronary artery without angina pectoris: Secondary | ICD-10-CM

## 2016-08-19 DIAGNOSIS — R079 Chest pain, unspecified: Secondary | ICD-10-CM | POA: Diagnosis present

## 2016-08-19 DIAGNOSIS — Z961 Presence of intraocular lens: Secondary | ICD-10-CM | POA: Diagnosis present

## 2016-08-19 DIAGNOSIS — M79662 Pain in left lower leg: Secondary | ICD-10-CM | POA: Diagnosis not present

## 2016-08-19 DIAGNOSIS — I3139 Other pericardial effusion (noninflammatory): Secondary | ICD-10-CM

## 2016-08-19 DIAGNOSIS — I350 Nonrheumatic aortic (valve) stenosis: Secondary | ICD-10-CM | POA: Diagnosis not present

## 2016-08-19 DIAGNOSIS — R778 Other specified abnormalities of plasma proteins: Secondary | ICD-10-CM

## 2016-08-19 DIAGNOSIS — M79609 Pain in unspecified limb: Secondary | ICD-10-CM | POA: Diagnosis not present

## 2016-08-19 DIAGNOSIS — I214 Non-ST elevation (NSTEMI) myocardial infarction: Secondary | ICD-10-CM | POA: Diagnosis present

## 2016-08-19 DIAGNOSIS — R570 Cardiogenic shock: Secondary | ICD-10-CM | POA: Diagnosis not present

## 2016-08-19 DIAGNOSIS — R319 Hematuria, unspecified: Secondary | ICD-10-CM | POA: Diagnosis not present

## 2016-08-19 DIAGNOSIS — Z7982 Long term (current) use of aspirin: Secondary | ICD-10-CM

## 2016-08-19 DIAGNOSIS — I82402 Acute embolism and thrombosis of unspecified deep veins of left lower extremity: Secondary | ICD-10-CM | POA: Diagnosis not present

## 2016-08-19 DIAGNOSIS — I10 Essential (primary) hypertension: Secondary | ICD-10-CM | POA: Diagnosis present

## 2016-08-19 DIAGNOSIS — E876 Hypokalemia: Secondary | ICD-10-CM | POA: Diagnosis not present

## 2016-08-19 DIAGNOSIS — Z9889 Other specified postprocedural states: Secondary | ICD-10-CM

## 2016-08-19 DIAGNOSIS — I313 Pericardial effusion (noninflammatory): Secondary | ICD-10-CM

## 2016-08-19 DIAGNOSIS — M7989 Other specified soft tissue disorders: Secondary | ICD-10-CM | POA: Diagnosis not present

## 2016-08-19 LAB — URINALYSIS, ROUTINE W REFLEX MICROSCOPIC
Bilirubin Urine: NEGATIVE
Glucose, UA: NEGATIVE mg/dL
Ketones, ur: NEGATIVE mg/dL
LEUKOCYTES UA: NEGATIVE
Nitrite: NEGATIVE
PROTEIN: NEGATIVE mg/dL
Specific Gravity, Urine: 1.02 (ref 1.005–1.030)
pH: 5.5 (ref 5.0–8.0)

## 2016-08-19 LAB — COMPREHENSIVE METABOLIC PANEL
ALT: 11 U/L — AB (ref 17–63)
ANION GAP: 8 (ref 5–15)
AST: 19 U/L (ref 15–41)
Albumin: 3.9 g/dL (ref 3.5–5.0)
Alkaline Phosphatase: 41 U/L (ref 38–126)
BUN: 22 mg/dL — ABNORMAL HIGH (ref 6–20)
CALCIUM: 9.2 mg/dL (ref 8.9–10.3)
CHLORIDE: 107 mmol/L (ref 101–111)
CO2: 27 mmol/L (ref 22–32)
CREATININE: 1.53 mg/dL — AB (ref 0.61–1.24)
GFR, EST AFRICAN AMERICAN: 46 mL/min — AB (ref >60)
GFR, EST NON AFRICAN AMERICAN: 39 mL/min — AB (ref >60)
Glucose, Bld: 117 mg/dL — ABNORMAL HIGH (ref 65–99)
Potassium: 4.3 mmol/L (ref 3.5–5.1)
Sodium: 142 mmol/L (ref 135–145)
Total Bilirubin: 0.7 mg/dL (ref 0.3–1.2)
Total Protein: 6.8 g/dL (ref 6.5–8.1)

## 2016-08-19 LAB — LIPASE, BLOOD: LIPASE: 29 U/L (ref 11–51)

## 2016-08-19 LAB — TROPONIN I
Troponin I: 0.72 ng/mL (ref <0.03)
Troponin I: 0.78 ng/mL (ref <0.03)
Troponin I: 0.81 ng/mL (ref <0.03)

## 2016-08-19 LAB — CBC
HCT: 36.8 % — ABNORMAL LOW (ref 39.0–52.0)
HEMOGLOBIN: 12.2 g/dL — AB (ref 13.0–17.0)
MCH: 30.5 pg (ref 26.0–34.0)
MCHC: 33.2 g/dL (ref 30.0–36.0)
MCV: 92 fL (ref 78.0–100.0)
PLATELETS: 162 10*3/uL (ref 150–400)
RBC: 4 MIL/uL — AB (ref 4.22–5.81)
RDW: 14 % (ref 11.5–15.5)
WBC: 7.1 10*3/uL (ref 4.0–10.5)

## 2016-08-19 LAB — URINE MICROSCOPIC-ADD ON
BACTERIA UA: NONE SEEN
SQUAMOUS EPITHELIAL / LPF: NONE SEEN
WBC UA: NONE SEEN WBC/hpf (ref 0–5)

## 2016-08-19 LAB — BRAIN NATRIURETIC PEPTIDE: B NATRIURETIC PEPTIDE 5: 737 pg/mL — AB (ref 0.0–100.0)

## 2016-08-19 MED ORDER — PANTOPRAZOLE SODIUM 40 MG PO TBEC
40.0000 mg | DELAYED_RELEASE_TABLET | Freq: Every day | ORAL | Status: DC
  Administered 2016-08-20 – 2016-08-30 (×12): 40 mg via ORAL
  Filled 2016-08-19 (×12): qty 1

## 2016-08-19 MED ORDER — ATORVASTATIN CALCIUM 40 MG PO TABS
40.0000 mg | ORAL_TABLET | Freq: Every day | ORAL | Status: DC
  Administered 2016-08-20 – 2016-08-29 (×10): 40 mg via ORAL
  Filled 2016-08-19 (×10): qty 1

## 2016-08-19 MED ORDER — FINASTERIDE 5 MG PO TABS
5.0000 mg | ORAL_TABLET | Freq: Every day | ORAL | Status: DC
  Administered 2016-08-20 – 2016-08-30 (×12): 5 mg via ORAL
  Filled 2016-08-19 (×12): qty 1

## 2016-08-19 MED ORDER — HEPARIN (PORCINE) IN NACL 100-0.45 UNIT/ML-% IJ SOLN
1350.0000 [IU]/h | INTRAMUSCULAR | Status: DC
  Filled 2016-08-19: qty 250

## 2016-08-19 MED ORDER — METOPROLOL TARTRATE 25 MG PO TABS
25.0000 mg | ORAL_TABLET | Freq: Two times a day (BID) | ORAL | Status: DC
  Administered 2016-08-20 – 2016-08-30 (×21): 25 mg via ORAL
  Filled 2016-08-19 (×22): qty 1

## 2016-08-19 MED ORDER — FUROSEMIDE 20 MG PO TABS
20.0000 mg | ORAL_TABLET | Freq: Every day | ORAL | Status: DC
  Administered 2016-08-20: 20 mg via ORAL
  Filled 2016-08-19: qty 1

## 2016-08-19 MED ORDER — ONDANSETRON HCL 4 MG/2ML IJ SOLN: 4.0000 mg | Freq: Four times a day (QID) | INTRAMUSCULAR | Status: DC | PRN

## 2016-08-19 MED ORDER — TERAZOSIN HCL 5 MG PO CAPS
5.0000 mg | ORAL_CAPSULE | Freq: Every day | ORAL | Status: DC
  Administered 2016-08-19 – 2016-08-30 (×11): 5 mg via ORAL
  Filled 2016-08-19 (×12): qty 1

## 2016-08-19 MED ORDER — ASPIRIN EC 325 MG PO TBEC: 325.0000 mg | DELAYED_RELEASE_TABLET | Freq: Every day | ORAL | Status: DC

## 2016-08-19 MED ORDER — ACETAMINOPHEN 325 MG PO TABS
650.0000 mg | ORAL_TABLET | ORAL | Status: DC | PRN
  Administered 2016-08-22 – 2016-08-28 (×6): 650 mg via ORAL
  Filled 2016-08-19 (×6): qty 2

## 2016-08-19 MED ORDER — ASPIRIN EC 325 MG PO TBEC
325.0000 mg | DELAYED_RELEASE_TABLET | Freq: Every day | ORAL | Status: DC
  Administered 2016-08-20 – 2016-08-24 (×5): 325 mg via ORAL
  Filled 2016-08-19 (×5): qty 1

## 2016-08-19 MED ORDER — NITROGLYCERIN 0.4 MG SL SUBL
0.4000 mg | SUBLINGUAL_TABLET | SUBLINGUAL | Status: AC | PRN
  Administered 2016-08-19 – 2016-08-23 (×3): 0.4 mg via SUBLINGUAL
  Filled 2016-08-19 (×3): qty 1

## 2016-08-19 MED ORDER — HEPARIN BOLUS VIA INFUSION: 4000.0000 [IU] | Freq: Once | INTRAVENOUS | Status: DC

## 2016-08-19 MED ORDER — ZOLPIDEM TARTRATE 5 MG PO TABS: 5.0000 mg | ORAL_TABLET | Freq: Every evening | ORAL | Status: DC | PRN

## 2016-08-19 MED ORDER — LISINOPRIL 40 MG PO TABS
40.0000 mg | ORAL_TABLET | Freq: Every day | ORAL | Status: DC
  Administered 2016-08-20: 40 mg via ORAL
  Filled 2016-08-19: qty 1

## 2016-08-19 MED ORDER — ASPIRIN 81 MG PO CHEW
324.0000 mg | CHEWABLE_TABLET | Freq: Once | ORAL | Status: AC
  Administered 2016-08-19: 324 mg via ORAL
  Filled 2016-08-19: qty 4

## 2016-08-19 MED ORDER — IOPAMIDOL (ISOVUE-300) INJECTION 61%
INTRAVENOUS | Status: AC
  Filled 2016-08-19: qty 30

## 2016-08-19 NOTE — Progress Notes (Signed)
ANTICOAGULATION CONSULT NOTE - Initial Consult  Pharmacy Consult for heparin Indication: chest pain/ACS  No Known Allergies  Patient Measurements: Height: 5\' 11"  (180.3 cm) Weight: 229 lb (103.9 kg) IBW/kg (Calculated) : 75.3 Heparin Dosing Weight: 97 kg  Vital Signs: Temp: 97.8 F (36.6 C) (09/15 1033) Temp Source: Oral (09/15 1033) BP: 125/68 (09/15 1800) Pulse Rate: 83 (09/15 1800)  Labs:  Recent Labs  08/19/16 1046 08/19/16 1151 08/19/16 1446 08/19/16 1750  HGB 12.2*  --   --   --   HCT 36.8*  --   --   --   PLT 162  --   --   --   CREATININE 1.53*  --   --   --   TROPONINI  --  0.72* 0.78* 0.81*    Estimated Creatinine Clearance: 42.5 mL/min (by C-G formula based on SCr of 1.53 mg/dL (H)).   Medical History: Past Medical History:  Diagnosis Date  . Arthritis   . Diabetes mellitus without complication (HCC)   . GERD (gastroesophageal reflux disease)    depending on diet  . Hypertension     Medications:  See medication history, was not on anticoagulation PTA  Assessment: 82 yo man with CP, elevated troponins, to start heparin Goal of Therapy:  Heparin level 0.3-0.7 units/ml Monitor platelets by anticoagulation protocol: Yes   Plan:  Heparin bolus 4000 units and drip at 1350 units/hr Check HL in ~8 hours. Daily HL and CBC while on heparin Monitor for bleeding complications  Tim CageSeay, Tim Hopkins 08/19/2016,7:01 PM

## 2016-08-19 NOTE — ED Triage Notes (Signed)
Pt comes in with lower abdominal pain starting last night. Pt denies any diarrhea. Denies any urinary problems, urinary odor is noted while triaging patient. States he feels weak and "heavy".
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
## 2016-08-19 NOTE — ED Notes (Signed)
Pt back from x-ray.
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
## 2016-08-20 ENCOUNTER — Encounter (HOSPITAL_COMMUNITY): Payer: Self-pay | Admitting: *Deleted

## 2016-08-20 DIAGNOSIS — I214 Non-ST elevation (NSTEMI) myocardial infarction: Principal | ICD-10-CM

## 2016-08-20 LAB — TROPONIN I
TROPONIN I: 0.63 ng/mL — AB (ref <0.03)
Troponin I: 0.74 ng/mL (ref <0.03)
Troponin I: 0.62 ng/mL (ref <0.03)

## 2016-08-20 LAB — CBC
HCT: 36.6 % — ABNORMAL LOW (ref 39.0–52.0)
HEMOGLOBIN: 11.5 g/dL — AB (ref 13.0–17.0)
MCH: 29.2 pg (ref 26.0–34.0)
MCHC: 31.4 g/dL (ref 30.0–36.0)
MCV: 92.9 fL (ref 78.0–100.0)
PLATELETS: 161 10*3/uL (ref 150–400)
RBC: 3.94 MIL/uL — AB (ref 4.22–5.81)
RDW: 14 % (ref 11.5–15.5)
WBC: 6.2 10*3/uL (ref 4.0–10.5)

## 2016-08-20 LAB — LIPID PANEL
CHOL/HDL RATIO: 4.7 ratio
Cholesterol: 142 mg/dL (ref 0–200)
HDL: 30 mg/dL — ABNORMAL LOW (ref >40)
LDL CALC: 95 mg/dL (ref 0–99)
Triglycerides: 85 mg/dL (ref <150)
VLDL: 17 mg/dL (ref 0–40)

## 2016-08-20 LAB — BASIC METABOLIC PANEL
Anion gap: 11 (ref 5–15)
BUN: 19 mg/dL (ref 6–20)
CHLORIDE: 107 mmol/L (ref 101–111)
CO2: 20 mmol/L — ABNORMAL LOW (ref 22–32)
Calcium: 8.8 mg/dL — ABNORMAL LOW (ref 8.9–10.3)
Creatinine, Ser: 1.52 mg/dL — ABNORMAL HIGH (ref 0.61–1.24)
GFR, EST AFRICAN AMERICAN: 46 mL/min — AB (ref >60)
GFR, EST NON AFRICAN AMERICAN: 40 mL/min — AB (ref >60)
Glucose, Bld: 161 mg/dL — ABNORMAL HIGH (ref 65–99)
POTASSIUM: 4.1 mmol/L (ref 3.5–5.1)
SODIUM: 138 mmol/L (ref 135–145)

## 2016-08-20 LAB — HEPARIN LEVEL (UNFRACTIONATED)
HEPARIN UNFRACTIONATED: 0.35 [IU]/mL (ref 0.30–0.70)
HEPARIN UNFRACTIONATED: 0.54 [IU]/mL (ref 0.30–0.70)

## 2016-08-20 LAB — HEMOGLOBIN A1C
Hgb A1c MFr Bld: 6.2 % — ABNORMAL HIGH (ref 4.8–5.6)
Mean Plasma Glucose: 131 mg/dL

## 2016-08-20 MED ORDER — HEPARIN (PORCINE) IN NACL 100-0.45 UNIT/ML-% IJ SOLN
1350.0000 [IU]/h | INTRAMUSCULAR | Status: DC
  Administered 2016-08-20 (×2): 1350 [IU]/h via INTRAVENOUS
  Filled 2016-08-20: qty 250

## 2016-08-20 MED ORDER — HEPARIN BOLUS VIA INFUSION
4000.0000 [IU] | Freq: Once | INTRAVENOUS | Status: AC
  Administered 2016-08-20: 4000 [IU] via INTRAVENOUS
  Filled 2016-08-20: qty 4000

## 2016-08-20 MED ORDER — CLOPIDOGREL BISULFATE 75 MG PO TABS
300.0000 mg | ORAL_TABLET | Freq: Once | ORAL | Status: AC
Start: 2016-08-20 — End: 2016-08-20
  Administered 2016-08-20: 300 mg via ORAL
  Filled 2016-08-20: qty 4

## 2016-08-20 MED ORDER — CLOPIDOGREL BISULFATE 75 MG PO TABS
75.0000 mg | ORAL_TABLET | Freq: Every day | ORAL | Status: DC
  Administered 2016-08-21 – 2016-08-23 (×3): 75 mg via ORAL
  Filled 2016-08-20 (×3): qty 1

## 2016-08-20 NOTE — Progress Notes (Signed)
Patient had an episode of chest pain this morning 5/10.  Administered 1 Nitro SL and pain subsided to a 0.  EKG obtained with no changes from previous.  BP dropped post-Nitro from 101/49 to 93/49, HR 70, O2 sat 95% on RA.  Patient resting pain-free.  Will continue to monitor.  Arva ChafeLester, Shaft Corigliano Michelle

## 2016-08-20 NOTE — Progress Notes (Signed)
PROGRESS NOTE    Tim Hopkins.  ONG:[id number] DOB: [birth year] DOA: 08/19/2016 PCP: Ignatius Specking, MD   Brief Narrative: Tim Hopkins. is a 82 y.o. male with a history of diet-controlled diabetes, GERD, hypertension. Patient seen emergency department for left lower quadrant discomfort for 1 week, but worse last night. The discomfort is vague and is intermittent. Pain is resolved now. Additionally, the patient has several months of burning of her chest pain, nonexertional in nature.  Some radiation and jaw and shoulder. Pain last for approximately 30 minutes and then is resolved. Occurs several times a week, but not daily. Takes bicarbonate, Tums which helps with the discomfort occasionally.  Course in emergency department: CT of the abdomen is normal. Patient had elevated troponins: 0.72 and 0.78 3 hours after first draw. Patient had nitroglycerin and aspirin which completely resolved his chest pain.    Assessment & Plan:   Principal Problem:   Chest pain Active Problems:   Elevated troponin   Diabetes mellitus without complication (HCC)   Essential hypertension   Esophageal reflux   Chronic kidney disease (CKD), stage III (moderate)   NSTEMI (non-ST elevated myocardial infarction) (HCC)  1-NSTEMI;  Presents with chest pain, mild elevation of troponin, EKG changes.  Cardiology consulted, plan for cath on Monday.  Continue with heparin GTT.  LDL 95.  On Beta-blocker, aspirin, statin.    2 Diabetes -  Continue diet control  3 chronic kidney disease stage 3;  Per record last cr at 1.8.  Continue lisinopril and lasix due to mild elevated cr, and in anticipation of Cath.   4 hypertension Continue lisinopril and lasix due to mild elevated cr, and in anticipation of Cath.   5 GERD Started  Protonix  6-Abdominal pain; CT scan negative for acute pathology.   DVT prophylaxis: Heparin  Code Status: Full code,  Family Communication: discussed with patient.  Disposition  Plan: remain in patient for treatment NSTEMI, and cardiac evaluation.    Consultants:   Cardiology    Procedures:  none  Antimicrobials:  none  Subjective: Feeling well, mild abdominal discomfort. Had BM   Objective: Vitals:   08/20/16 0115 08/20/16 0332 08/20/16 0443 08/20/16 0447  BP: 128/66 (!) 100/51 (!) 101/49 (!) 93/49  Pulse: 88 72 70   Resp: 18 18    Temp: 97.6 F (36.4 C) 97.4 F (36.3 C)    TempSrc: Oral Oral    SpO2: 99% 98%    Weight:      Height:        Intake/Output Summary (Last 24 hours) at 08/20/16 0713 Last data filed at 08/20/16 0659  Gross per 24 hour  Intake            63.68 ml  Output              320 ml  Net          -256.32 ml   Filed Weights   08/19/16 1034  Weight: 103.9 kg (229 lb)    Examination:  General exam: Appears calm and comfortable  Respiratory system: Clear to auscultation. Respiratory effort normal. Cardiovascular system: S1 & S2 heard, RRR. No JVD, murmurs, rubs, gallops or clicks. No pedal edema. Gastrointestinal system: Abdomen is nondistended, soft and nontender. No organomegaly or masses felt. Normal bowel sounds heard. Central nervous system: Alert and oriented. No focal neurological deficits. Extremities: Symmetric 5 x 5 power. Skin: No rashes, lesions or ulcers Psychiatry: Judgement and insight appear normal. Mood &  affect appropriate.     Data Reviewed: I have personally reviewed following labs and imaging studies  CBC:  Recent Labs Lab 08/19/16 1046  WBC 7.1  HGB 12.2*  HCT 36.8*  MCV 92.0  PLT 162   Basic Metabolic Panel:  Recent Labs Lab 08/19/16 1046  NA 142  K 4.3  CL 107  CO2 27  GLUCOSE 117*  BUN 22*  CREATININE 1.53*  CALCIUM 9.2   GFR: Estimated Creatinine Clearance: 42.5 mL/min (by C-G formula based on SCr of 1.53 mg/dL (H)). Liver Function Tests:  Recent Labs Lab 08/19/16 1046  AST 19  ALT 11*  ALKPHOS 41  BILITOT 0.7  PROT 6.8  ALBUMIN 3.9    Recent Labs Lab  08/19/16 1046  LIPASE 29   No results for input(s): AMMONIA in the last 168 hours. Coagulation Profile: No results for input(s): INR, PROTIME in the last 168 hours. Cardiac Enzymes:  Recent Labs Lab 08/19/16 1151 08/19/16 1446 08/19/16 1750 08/20/16 0152  TROPONINI 0.72* 0.78* 0.81* 0.63*   BNP (last 3 results) No results for input(s): PROBNP in the last 8760 hours. HbA1C:  Recent Labs  08/19/16 1046  HGBA1C 6.2*   CBG: No results for input(s): GLUCAP in the last 168 hours. Lipid Profile:  Recent Labs  08/20/16 0153  CHOL 142  HDL 30*  LDLCALC 95  TRIG 85  CHOLHDL 4.7   Thyroid Function Tests: No results for input(s): TSH, T4TOTAL, FREET4, T3FREE, THYROIDAB in the last 72 hours. Anemia Panel: No results for input(s): VITAMINB12, FOLATE, FERRITIN, TIBC, IRON, RETICCTPCT in the last 72 hours. Sepsis Labs: No results for input(s): PROCALCITON, LATICACIDVEN in the last 168 hours.  No results found for this or any previous visit (from the past 240 hour(s)).       Radiology Studies: Ct Abdomen Pelvis Wo Contrast  Result Date: 08/19/2016 CLINICAL DATA:  Acute abdominal and pelvic pain for 1 day. EXAM: CT ABDOMEN AND PELVIS WITHOUT CONTRAST TECHNIQUE: Multidetector CT imaging of the abdomen and pelvis was performed following the standard protocol without IV contrast. COMPARISON:  None. FINDINGS: Please note that parenchymal abnormalities may be missed without intravenous contrast. Lower chest: Small bilateral pleural effusions, small pericardial effusion and mild bibasilar atelectasis noted. Cardiomegaly and coronary artery calcifications identified. Hepatobiliary: Unremarkable. The patient is status post cholecystectomy. There is no evidence of biliary dilatation. Pancreas: Unremarkable. Spleen: Unremarkable Adrenals/Urinary Tract: Moderate to severe bilateral renal atrophy identified. There is no evidence of hydronephrosis, definite renal mass or urinary calculi. The  adrenal glands and bladder are unremarkable. Stomach/Bowel: Colonic diverticulosis noted without evidence of diverticulitis. No bowel obstruction identified. The appendix is normal. Vascular/Lymphatic: Abdominal aortic atherosclerotic calcifications noted without aneurysm. No enlarged lymph nodes identified. Reproductive: Marked prostate enlargement noted. Other: No free fluid, pneumoperitoneum or focal collection. A small left paramedian supraumbilical hernia containing fat is noted. Musculoskeletal: Degenerative changes within the lumbar spine identified. No acute or suspicious abnormality noted. IMPRESSION: Small bilateral pleural effusions, small pericardial effusion and mild bibasilar atelectasis. No evidence of acute abnormality within the abdomen or pelvis. Abdominal aortic atherosclerosis. Cardiomegaly and coronary disease. Renal atrophy and marked prostamegaly. Electronically Signed   By: Harmon Pier M.D.   On: 08/19/2016 14:47   Dg Chest 2 View  Result Date: 08/19/2016 CLINICAL DATA:  Chest pain.  Abdominal pain. EXAM: CHEST  2 VIEW COMPARISON:  None. FINDINGS: Top-normal heart size . Aortic atherosclerosis. Otherwise normal mediastinal contour. No pneumothorax. Small right pleural effusion. No left pleural effusion.  No overt pulmonary edema. Hyperinflated lungs. Patchy opacity at the right lung base. Curvilinear opacities at the left lung base. IMPRESSION: 1. Patchy right lung mass opacity, which could represent atelectasis, aspiration or pneumonia. 2. Curvilinear left lung base opacity, favor scarring or atelectasis. 3. Hyperinflated lungs, suggesting obstructive lung disease. 4. Aortic atherosclerosis. Electronically Signed   By: Delbert PhenixJason A Poff M.D.   On: 08/19/2016 12:35        Scheduled Meds: . aspirin EC  325 mg Oral Daily  . atorvastatin  40 mg Oral q1800  . [START ON 08/21/2016] clopidogrel  75 mg Oral Daily  . finasteride  5 mg Oral Daily  . furosemide  20 mg Oral Daily  . lisinopril   40 mg Oral Daily  . metoprolol tartrate  25 mg Oral BID  . pantoprazole  40 mg Oral Daily  . terazosin  5 mg Oral Daily   Continuous Infusions: . heparin 1,350 Units/hr (08/20/16 0216)     LOS: 1 day    Time spent: 35 minutes.     Alba Coryegalado, Jamario Colina A, MD [facility] Pager [phone]  If 7PM-7AM, please contact night-coverage [email or web address] Password [email or web address] 08/20/2016, 7:13 AM

## 2016-08-20 NOTE — Progress Notes (Signed)
ANTICOAGULATION CONSULT NOTE   Pharmacy Consult for heparin Indication: chest pain/ACS  No Known Allergies  Patient Measurements: Height: 5\' 11"  (180.3 cm) Weight: 229 lb (103.9 kg) IBW/kg (Calculated) : 75.3 Heparin Dosing Weight: 97 kg  Vital Signs: Temp: 97.5 F (36.4 C) (09/16 1447) Temp Source: Oral (09/16 1447) BP: 94/48 (09/16 1447) Pulse Rate: 66 (09/16 1447)  Labs:  Recent Labs  08/19/16 1046  08/20/16 0020 08/20/16 0152 08/20/16 0759 08/20/16 1247  HGB 12.2*  --   --   --  11.5*  --   HCT 36.8*  --   --   --  36.6*  --   PLT 162  --   --   --  161  --   HEPARINUNFRC  --   --  <0.10*  --  0.35  --   CREATININE 1.53*  --   --   --  1.52*  --   TROPONINI  --   < >  --  0.63* 0.74* 0.62*  < > = values in this interval not displayed.  Estimated Creatinine Clearance: 42.8 mL/min (by C-G formula based on SCr of 1.52 mg/dL (H)).  . heparin 1,350 Units/hr (08/20/16 0216)    Assessment: 82 yo man with CP, elevated troponins, to start heparin.  Heparin level currently therapeutic.  No bleeding or complications noted.  Goal of Therapy:  Heparin level 0.3-0.7 units/ml Monitor platelets by anticoagulation protocol: Yes   Plan:  Continue IV heparin at current rate. F/u confirmatory heparin level now. Daily HL and CBC while on heparin Monitor for bleeding complications  Tad MooreJessica Latoya Maulding, Pharm D, BCPS  Clinical Pharmacist Pager [phone]  08/20/2016 3:16 PM

## 2016-08-20 NOTE — Consult Note (Addendum)
Cardiology Consult Note:  RFC: NSTEMI  HPI: 44yoM with HTN, diet controlled diabetes, admitted with chest pain that lasted 30 minutes and found to have ECG with subtle ST elevations in V1 and AVR, subtle diffuse ST depressions, and now ruled in with positive troponin.  States his symptoms have been going on for about a week and associated with malaise, dyspnea, and mild edema.  Some radiation to jaw and shoulder and he has been treating with antacids.  He has since been treated with a full dose ASA, IV heparin, atorvastatin 40, metoprolol and an ACE inhibior  ROS  "Pt denies any fevers, chills, nausea, vomiting, diarrhea, constipation, abdominal pain, shortness of breath, dyspnea on exertion, orthopnea, cough, wheezing, palpitations, headache, vision changes, lightheadedness, dizziness, melena, rectal bleeding.  Review of systems are otherwise negative"   PMHx Past Medical History:  Diagnosis Date  . Arthritis   . Diabetes mellitus without complication (HCC)   . GERD (gastroesophageal reflux disease)    depending on diet  . Hypertension    Medications Scheduled Meds: . aspirin EC  325 mg Oral Daily  . atorvastatin  40 mg Oral q1800  . finasteride  5 mg Oral Daily  . furosemide  20 mg Oral Daily  . lisinopril  40 mg Oral Daily  . metoprolol tartrate  25 mg Oral BID  . pantoprazole  40 mg Oral Daily  . terazosin  5 mg Oral Daily   Continuous Infusions: . heparin 1,350 Units/hr (08/20/16 0216)   PRN Meds:.acetaminophen, nitroGLYCERIN, ondansetron (ZOFRAN) IV, zolpidem  Allergies No Known Allergies  FHx Roann  Soc Hx: Social History   Social History  . Marital status: Married    Spouse name: N/A  . Number of children: N/A  . Years of education: N/A   Occupational History  . Not on file.   Social History Main Topics  . Smoking status: Former Games developer  . Smokeless tobacco: Never Used  . Alcohol use Yes     Comment: rarely  . Drug use: No  . Sexual activity: Not on  file   Other Topics Concern  . Not on file   Social History Narrative  . No narrative on file   Physical Exam  BP 130/69 (BP Location: Right Arm)   Pulse 96   Temp 97.5 F (36.4 C) (Oral)   Resp 18   Ht 5\' 11"  (1.803 m)   Wt 103.9 kg (229 lb)   SpO2 100%   BMI 31.94 kg/m  88yoM in NAD JVP normal CTAB RRR s1 s2 no mrg  soft nt nd Trace edema AAO without gross focal deficit  Labs: CBC    Component Value Date/Time   WBC 7.1 08/19/2016 1046   RBC 4.00 (L) 08/19/2016 1046   HGB 12.2 (L) 08/19/2016 1046   HCT 36.8 (L) 08/19/2016 1046   PLT 162 08/19/2016 1046   MCV 92.0 08/19/2016 1046   MCH 30.5 08/19/2016 1046   MCHC 33.2 08/19/2016 1046   RDW 14.0 08/19/2016 1046   Troponin .72--> .78 --> .81  Cr 1.53 BNP 737  ECG as per HPI, sinus  A/P 88yoM with HTN and DM, admitted with NSTEMI.  Currently chest pain free and comfortable  Recs: 1. Agree with BB, ACE, high intensity statin, IV heparin 2. Given age, unlikely to be a CABG candidate, would load with 300 plavix now, then 75 mg daily starting 9/17 3. Cr is elevated but would proceed with LHC on Monday.  Make NPO Sunday night  4. Rest of meds per medicine plan  Dossie ArbourEdward Sudeep Scheibel, MD

## 2016-08-20 NOTE — Progress Notes (Signed)
SUBJECTIVE: The patient is doing well today.  At this time, he denies shortness of breath, or any new concerns. Had chest pain overnight relieved with NTG without ECG changes.  Only complaint of abdominal pain today.  Marland Kitchen aspirin EC  325 mg Oral Daily  . atorvastatin  40 mg Oral q1800  . [START ON 08/21/2016] clopidogrel  75 mg Oral Daily  . finasteride  5 mg Oral Daily  . metoprolol tartrate  25 mg Oral BID  . pantoprazole  40 mg Oral Daily  . terazosin  5 mg Oral Daily   . heparin 1,350 Units/hr (08/20/16 0216)    OBJECTIVE: Physical Exam: Vitals:   08/20/16 0115 08/20/16 0332 08/20/16 0443 08/20/16 0447  BP: 128/66 (!) 100/51 (!) 101/49 (!) 93/49  Pulse: 88 72 70   Resp: 18 18    Temp: 97.6 F (36.4 C) 97.4 F (36.3 C)    TempSrc: Oral Oral    SpO2: 99% 98%    Weight:      Height:        Intake/Output Summary (Last 24 hours) at 08/20/16 1018 Last data filed at 08/20/16 0659  Gross per 24 hour  Intake            63.68 ml  Output              320 ml  Net          -256.32 ml    Telemetry reveals sinus rhythm  GEN- The patient is well appearing, alert and oriented x 3 today.   Head- normocephalic, atraumatic Eyes-  Sclera clear, conjunctiva pink Ears- hearing intact Oropharynx- clear Neck- supple, no JVP Lymph- no cervical lymphadenopathy Lungs- Clear to ausculation bilaterally, normal work of breathing Heart- Regular rate and rhythm, no murmurs, rubs or gallops, PMI not laterally displaced GI- soft, NT, ND, + BS Extremities- no clubbing, cyanosis, or edema Skin- no rash or lesion Psych- euthymic mood, full affect Neuro- strength and sensation are intact  LABS: Basic Metabolic Panel:  Recent Labs  78/29/56 1046 08/20/16 0759  NA 142 138  K 4.3 4.1  CL 107 107  CO2 27 20*  GLUCOSE 117* 161*  BUN 22* 19  CREATININE 1.53* 1.52*  CALCIUM 9.2 8.8*   Liver Function Tests:  Recent Labs  08/19/16 1046  AST 19  ALT 11*  ALKPHOS 41  BILITOT 0.7    PROT 6.8  ALBUMIN 3.9    Recent Labs  08/19/16 1046  LIPASE 29   CBC:  Recent Labs  08/19/16 1046 08/20/16 0759  WBC 7.1 6.2  HGB 12.2* 11.5*  HCT 36.8* 36.6*  MCV 92.0 92.9  PLT 162 161   Cardiac Enzymes:  Recent Labs  08/19/16 1750 08/20/16 0152 08/20/16 0759  TROPONINI 0.81* 0.63* 0.74*   BNP: Invalid input(s): POCBNP D-Dimer: No results for input(s): DDIMER in the last 72 hours. Hemoglobin A1C:  Recent Labs  08/19/16 1046  HGBA1C 6.2*   Fasting Lipid Panel:  Recent Labs  08/20/16 0153  CHOL 142  HDL 30*  LDLCALC 95  TRIG 85  CHOLHDL 4.7   Thyroid Function Tests: No results for input(s): TSH, T4TOTAL, T3FREE, THYROIDAB in the last 72 hours.  Invalid input(s): FREET3 Anemia Panel: No results for input(s): VITAMINB12, FOLATE, FERRITIN, TIBC, IRON, RETICCTPCT in the last 72 hours.  RADIOLOGY: Ct Abdomen Pelvis Wo Contrast  Result Date: 08/19/2016 CLINICAL DATA:  Acute abdominal and pelvic pain for 1 day. EXAM: CT ABDOMEN AND PELVIS  WITHOUT CONTRAST TECHNIQUE: Multidetector CT imaging of the abdomen and pelvis was performed following the standard protocol without IV contrast. COMPARISON:  None. FINDINGS: Please note that parenchymal abnormalities may be missed without intravenous contrast. Lower chest: Small bilateral pleural effusions, small pericardial effusion and mild bibasilar atelectasis noted. Cardiomegaly and coronary artery calcifications identified. Hepatobiliary: Unremarkable. The patient is status post cholecystectomy. There is no evidence of biliary dilatation. Pancreas: Unremarkable. Spleen: Unremarkable Adrenals/Urinary Tract: Moderate to severe bilateral renal atrophy identified. There is no evidence of hydronephrosis, definite renal mass or urinary calculi. The adrenal glands and bladder are unremarkable. Stomach/Bowel: Colonic diverticulosis noted without evidence of diverticulitis. No bowel obstruction identified. The appendix is  normal. Vascular/Lymphatic: Abdominal aortic atherosclerotic calcifications noted without aneurysm. No enlarged lymph nodes identified. Reproductive: Marked prostate enlargement noted. Other: No free fluid, pneumoperitoneum or focal collection. A small left paramedian supraumbilical hernia containing fat is noted. Musculoskeletal: Degenerative changes within the lumbar spine identified. No acute or suspicious abnormality noted. IMPRESSION: Small bilateral pleural effusions, small pericardial effusion and mild bibasilar atelectasis. No evidence of acute abnormality within the abdomen or pelvis. Abdominal aortic atherosclerosis. Cardiomegaly and coronary disease. Renal atrophy and marked prostamegaly. Electronically Signed   By: Harmon PierJeffrey  Hu M.D.   On: 08/19/2016 14:47   Dg Chest 2 View  Result Date: 08/19/2016 CLINICAL DATA:  Chest pain.  Abdominal pain. EXAM: CHEST  2 VIEW COMPARISON:  None. FINDINGS: Top-normal heart size . Aortic atherosclerosis. Otherwise normal mediastinal contour. No pneumothorax. Small right pleural effusion. No left pleural effusion. No overt pulmonary edema. Hyperinflated lungs. Patchy opacity at the right lung base. Curvilinear opacities at the left lung base. IMPRESSION: 1. Patchy right lung mass opacity, which could represent atelectasis, aspiration or pneumonia. 2. Curvilinear left lung base opacity, favor scarring or atelectasis. 3. Hyperinflated lungs, suggesting obstructive lung disease. 4. Aortic atherosclerosis. Electronically Signed   By: Delbert PhenixJason A Poff M.D.   On: 08/19/2016 12:35    ASSESSMENT AND PLAN:  Principal Problem:   Chest pain Active Problems:   Elevated troponin   Diabetes mellitus without complication (HCC)   Essential hypertension   Esophageal reflux   Chronic kidney disease (CKD), stage III (moderate)   NSTEMI (non-ST elevated myocardial infarction) (HCC) 1. NSTEMI: Typical and atypical features which she describes as heartburn, but with elevated  troponin.  Chest pain continued overnight with improvement with NTG.  Likely cath Monday or Tuesday depending on kidney function. Continue heparin GGT.  Travontae Freiberger give Maalox for abdominal pain.  2. Diabetes: Significant cardiovascular risk factor. Do not find be on any medications.  3. Hypertension: Currently well-controlled. Continue ACE inhibitor.  4. Questionable diastolic CHF: Lower extremity edema is noted. This began by mouth Lasix 20 mg daily. Follow labs. Creatinine stable. TTE pending.  Fountain Derusha Jorja LoaMartin Zakye Baby, MD 08/20/2016 10:18 AM

## 2016-08-20 NOTE — Progress Notes (Signed)
ANTICOAGULATION CONSULT NOTE   Pharmacy Consult for heparin Indication: chest pain/ACS  No Known Allergies  Patient Measurements: Height: 5\' 11"  (180.3 cm) Weight: 229 lb (103.9 kg) IBW/kg (Calculated) : 75.3 Heparin Dosing Weight: 97 kg  Vital Signs: Temp: 97.5 F (36.4 C) (09/16 1447) Temp Source: Oral (09/16 1447) BP: 94/48 (09/16 1447) Pulse Rate: 66 (09/16 1447)  Labs:  Recent Labs  08/19/16 1046  08/20/16 0020 08/20/16 0152 08/20/16 0759 08/20/16 1247 08/20/16 1537  HGB 12.2*  --   --   --  11.5*  --   --   HCT 36.8*  --   --   --  36.6*  --   --   PLT 162  --   --   --  161  --   --   HEPARINUNFRC  --   --  <0.10*  --  0.35  --  0.54  CREATININE 1.53*  --   --   --  1.52*  --   --   TROPONINI  --   < >  --  0.63* 0.74* 0.62*  --   < > = values in this interval not displayed.  Estimated Creatinine Clearance: 42.8 mL/min (by C-G formula based on SCr of 1.52 mg/dL (H)).  . heparin 1,350 Units/hr (08/20/16 1556)    Assessment: 82 yo man with CP, elevated troponins, to start heparin.  Heparin level currently therapeutic.  No bleeding or complications noted. Confirm level came back therapeutic.   Goal of Therapy:  Heparin level 0.3-0.7 units/ml Monitor platelets by anticoagulation protocol: Yes   Plan:  Continue IV heparin at current rate. Daily HL and CBC while on heparin Monitor for bleeding complications  Ulyses SouthwardMinh Pham, PharmD Pager: [phone] 08/20/2016 5:04 PM

## 2016-08-21 ENCOUNTER — Inpatient Hospital Stay (HOSPITAL_COMMUNITY): Payer: Medicare Other

## 2016-08-21 DIAGNOSIS — R079 Chest pain, unspecified: Secondary | ICD-10-CM

## 2016-08-21 DIAGNOSIS — I255 Ischemic cardiomyopathy: Secondary | ICD-10-CM

## 2016-08-21 LAB — CBC
HEMATOCRIT: 32.6 % — AB (ref 39.0–52.0)
HEMOGLOBIN: 10.4 g/dL — AB (ref 13.0–17.0)
MCH: 29.5 pg (ref 26.0–34.0)
MCHC: 31.9 g/dL (ref 30.0–36.0)
MCV: 92.6 fL (ref 78.0–100.0)
Platelets: 142 10*3/uL — ABNORMAL LOW (ref 150–400)
RBC: 3.52 MIL/uL — AB (ref 4.22–5.81)
RDW: 14.2 % (ref 11.5–15.5)
WBC: 6.7 10*3/uL (ref 4.0–10.5)

## 2016-08-21 LAB — ECHOCARDIOGRAM COMPLETE
AV Area VTI index: 0.52 cm2/m2
AV Peak grad: 18 mmHg
AV pk vel: 211 cm/s
AV vel: 1.15
AVAREAMEANV: 1.03 cm2
AVAREAMEANVIN: 0.46 cm2/m2
AVAREAVTI: 1 cm2
AVCELMEANRAT: 0.25
AVG: 9 mmHg
Ao pk vel: 0.24 m/s
CHL CUP AV PEAK INDEX: 0.45
CHL CUP AV VALUE AREA INDEX: 0.52
CHL CUP DOP CALC LVOT VTI: 12.6 cm
CHL CUP MV DEC (S): 180
CHL CUP RV SYS PRESS: 57 mmHg
CHL CUP TV REG PEAK VELOCITY: 349 cm/s
DOP CAL AO MEAN VELOCITY: 140 cm/s
E decel time: 180 msec
EERAT: 24.15
FS: 25 % — AB (ref 28–44)
HEIGHTINCHES: 71 in
IVS/LV PW RATIO, ED: 1.07
LA vol A4C: 72.6 ml
LADIAMINDEX: 2.02 cm/m2
LASIZE: 45 mm
LAVOL: 83.8 mL
LAVOLIN: 37.6 mL/m2
LDCA: 4.15 cm2
LEFT ATRIUM END SYS DIAM: 45 mm
LV E/e' medial: 24.15
LV E/e'average: 24.15
LV PW d: 15 mm — AB (ref 0.6–1.1)
LV e' LATERAL: 4.68 cm/s
LVOTD: 23 mm
LVOTPV: 50.9 cm/s
LVOTSV: 52 mL
LVOTVTI: 0.28 cm
Lateral S' vel: 8.81 cm/s
MV pk A vel: 73.7 m/s
MV pk E vel: 113 m/s
MVPG: 5 mmHg
P 1/2 time: 460 ms
PISA EROA: 0.56 cm2
TDI e' lateral: 4.68
TDI e' medial: 7.18
TR max vel: 349 cm/s
VTI: 155 cm
VTI: 45.6 cm
Valve area: 1.15 cm2
WEIGHTICAEL: 3664 [oz_av]

## 2016-08-21 LAB — BASIC METABOLIC PANEL
ANION GAP: 7 (ref 5–15)
BUN: 23 mg/dL — ABNORMAL HIGH (ref 6–20)
CHLORIDE: 108 mmol/L (ref 101–111)
CO2: 25 mmol/L (ref 22–32)
CREATININE: 1.73 mg/dL — AB (ref 0.61–1.24)
Calcium: 8.3 mg/dL — ABNORMAL LOW (ref 8.9–10.3)
GFR calc non Af Amer: 34 mL/min — ABNORMAL LOW (ref >60)
GFR, EST AFRICAN AMERICAN: 39 mL/min — AB (ref >60)
Glucose, Bld: 164 mg/dL — ABNORMAL HIGH (ref 65–99)
POTASSIUM: 3.6 mmol/L (ref 3.5–5.1)
SODIUM: 140 mmol/L (ref 135–145)

## 2016-08-21 LAB — HEPARIN LEVEL (UNFRACTIONATED)
Heparin Unfractionated: 1.6 IU/mL — ABNORMAL HIGH (ref 0.30–0.70)
Heparin Unfractionated: 1.15 IU/mL — ABNORMAL HIGH (ref 0.30–0.70)

## 2016-08-21 MED ORDER — HEPARIN (PORCINE) IN NACL 100-0.45 UNIT/ML-% IJ SOLN
1050.0000 [IU]/h | INTRAMUSCULAR | Status: DC
  Administered 2016-08-21: 1050 [IU]/h via INTRAVENOUS
  Filled 2016-08-21: qty 250

## 2016-08-21 MED ORDER — PERFLUTREN LIPID MICROSPHERE
INTRAVENOUS | Status: AC
  Administered 2016-08-21: 2 mL
  Filled 2016-08-21: qty 10

## 2016-08-21 MED ORDER — SODIUM CHLORIDE 0.9 % IV SOLN
INTRAVENOUS | Status: AC
  Administered 2016-08-21: 10:00:00 via INTRAVENOUS

## 2016-08-21 MED ORDER — HEPARIN (PORCINE) IN NACL 100-0.45 UNIT/ML-% IJ SOLN
700.0000 [IU]/h | INTRAMUSCULAR | Status: DC
  Administered 2016-08-22: 650 [IU]/h via INTRAVENOUS
  Administered 2016-08-22: 700 [IU]/h via INTRAVENOUS
  Filled 2016-08-21: qty 250

## 2016-08-21 NOTE — Progress Notes (Signed)
SUBJECTIVE: The patient is doing well today.  At this time, he denies shortness of breath, or any new concerns. Minimal chest pain overnight.  Marland Kitchen aspirin EC  325 mg Oral Daily  . atorvastatin  40 mg Oral q1800  . clopidogrel  75 mg Oral Daily  . finasteride  5 mg Oral Daily  . metoprolol tartrate  25 mg Oral BID  . pantoprazole  40 mg Oral Daily  . terazosin  5 mg Oral Daily   . heparin 1,050 Units/hr (08/21/16 0821)    OBJECTIVE: Physical Exam: Vitals:   08/20/16 0447 08/20/16 1447 08/20/16 1950 08/21/16 0557  BP: (!) 93/49 (!) 94/48 (!) 92/50 (!) 100/39  Pulse:  66 71 60  Resp:  18 18 17   Temp:  97.5 F (36.4 C) 97.3 F (36.3 C) 97.7 F (36.5 C)  TempSrc:  Oral Oral Oral  SpO2:  97% 93% 97%  Weight:      Height:        Intake/Output Summary (Last 24 hours) at 08/21/16 1610 Last data filed at 08/21/16 0900  Gross per 24 hour  Intake           486.83 ml  Output              300 ml  Net           186.83 ml    Telemetry reveals sinus rhythm  GEN- The patient is well appearing, alert and oriented x 3 today.   Head- normocephalic, atraumatic Eyes-  Sclera clear, conjunctiva pink Ears- hearing intact Oropharynx- clear Neck- supple, no JVP Lymph- no cervical lymphadenopathy Lungs- Clear to ausculation bilaterally, normal work of breathing Heart- Regular rate and rhythm, no murmurs, rubs or gallops, PMI not laterally displaced GI- soft, NT, ND, + BS Extremities- no clubbing, cyanosis, or edema Skin- no rash or lesion Psych- euthymic mood, full affect Neuro- strength and sensation are intact  LABS: Basic Metabolic Panel:  Recent Labs  96/04/54 0759 08/21/16 0311  NA 138 140  K 4.1 3.6  CL 107 108  CO2 20* 25  GLUCOSE 161* 164*  BUN 19 23*  CREATININE 1.52* 1.73*  CALCIUM 8.8* 8.3*   Liver Function Tests:  Recent Labs  08/19/16 1046  AST 19  ALT 11*  ALKPHOS 41  BILITOT 0.7  PROT 6.8  ALBUMIN 3.9    Recent Labs  08/19/16 1046  LIPASE  29   CBC:  Recent Labs  08/20/16 0759 08/21/16 0311  WBC 6.2 6.7  HGB 11.5* 10.4*  HCT 36.6* 32.6*  MCV 92.9 92.6  PLT 161 142*   Cardiac Enzymes:  Recent Labs  08/20/16 0152 08/20/16 0759 08/20/16 1247  TROPONINI 0.63* 0.74* 0.62*   BNP: Invalid input(s): POCBNP D-Dimer: No results for input(s): DDIMER in the last 72 hours. Hemoglobin A1C:  Recent Labs  08/19/16 1046  HGBA1C 6.2*   Fasting Lipid Panel:  Recent Labs  08/20/16 0153  CHOL 142  HDL 30*  LDLCALC 95  TRIG 85  CHOLHDL 4.7   Thyroid Function Tests: No results for input(s): TSH, T4TOTAL, T3FREE, THYROIDAB in the last 72 hours.  Invalid input(s): FREET3 Anemia Panel: No results for input(s): VITAMINB12, FOLATE, FERRITIN, TIBC, IRON, RETICCTPCT in the last 72 hours.  RADIOLOGY: Ct Abdomen Pelvis Wo Contrast  Result Date: 08/19/2016 CLINICAL DATA:  Acute abdominal and pelvic pain for 1 day. EXAM: CT ABDOMEN AND PELVIS WITHOUT CONTRAST TECHNIQUE: Multidetector CT imaging of the abdomen and pelvis was performed  following the standard protocol without IV contrast. COMPARISON:  None. FINDINGS: Please note that parenchymal abnormalities may be missed without intravenous contrast. Lower chest: Small bilateral pleural effusions, small pericardial effusion and mild bibasilar atelectasis noted. Cardiomegaly and coronary artery calcifications identified. Hepatobiliary: Unremarkable. The patient is status post cholecystectomy. There is no evidence of biliary dilatation. Pancreas: Unremarkable. Spleen: Unremarkable Adrenals/Urinary Tract: Moderate to severe bilateral renal atrophy identified. There is no evidence of hydronephrosis, definite renal mass or urinary calculi. The adrenal glands and bladder are unremarkable. Stomach/Bowel: Colonic diverticulosis noted without evidence of diverticulitis. No bowel obstruction identified. The appendix is normal. Vascular/Lymphatic: Abdominal aortic atherosclerotic  calcifications noted without aneurysm. No enlarged lymph nodes identified. Reproductive: Marked prostate enlargement noted. Other: No free fluid, pneumoperitoneum or focal collection. A small left paramedian supraumbilical hernia containing fat is noted. Musculoskeletal: Degenerative changes within the lumbar spine identified. No acute or suspicious abnormality noted. IMPRESSION: Small bilateral pleural effusions, small pericardial effusion and mild bibasilar atelectasis. No evidence of acute abnormality within the abdomen or pelvis. Abdominal aortic atherosclerosis. Cardiomegaly and coronary disease. Renal atrophy and marked prostamegaly. Electronically Signed   By: Harmon PierJeffrey  Hu M.D.   On: 08/19/2016 14:47   Dg Chest 2 View  Result Date: 08/19/2016 CLINICAL DATA:  Chest pain.  Abdominal pain. EXAM: CHEST  2 VIEW COMPARISON:  None. FINDINGS: Top-normal heart size . Aortic atherosclerosis. Otherwise normal mediastinal contour. No pneumothorax. Small right pleural effusion. No left pleural effusion. No overt pulmonary edema. Hyperinflated lungs. Patchy opacity at the right lung base. Curvilinear opacities at the left lung base. IMPRESSION: 1. Patchy right lung mass opacity, which could represent atelectasis, aspiration or pneumonia. 2. Curvilinear left lung base opacity, favor scarring or atelectasis. 3. Hyperinflated lungs, suggesting obstructive lung disease. 4. Aortic atherosclerosis. Electronically Signed   By: Delbert PhenixJason A Poff M.D.   On: 08/19/2016 12:35    ASSESSMENT AND PLAN:  Principal Problem:   Chest pain Active Problems:   Elevated troponin   Diabetes mellitus without complication (HCC)   Essential hypertension   Esophageal reflux   Chronic kidney disease (CKD), stage III (moderate)   NSTEMI (non-ST elevated myocardial infarction) (HCC) 1. NSTEMI: Typical and atypical features which she describes as heartburn, but with elevated troponin.  Chest pain continued overnight with improvement with NTG.   Likely cath Monday or Tuesday depending on kidney function. Continue heparin GGT. Jamespaul Secrist give 500cc fluids.  2. Diabetes: Significant cardiovascular risk factor. Do not find be on any medications.  3. Hypertension: Currently well-controlled. Continue ACE inhibitor.  4. Questionable diastolic CHF: Lower extremity edema is noted.  Follow labs. Creatinine stable. TTE pending.  Deiondra Denley Jorja LoaMartin Keilynn Marano, MD 08/21/2016 9:21 AM

## 2016-08-21 NOTE — Progress Notes (Signed)
  Echocardiogram 2D Echocardiogram with Definity has been performed.  Tim Hopkins 08/21/2016, 1:12 PM

## 2016-08-21 NOTE — Progress Notes (Signed)
ANTICOAGULATION CONSULT NOTE   Pharmacy Consult for heparin Indication: chest pain/ACS  No Known Allergies  Patient Measurements: Height: 5\' 11"  (180.3 cm) Weight: 229 lb (103.9 kg) IBW/kg (Calculated) : 75.3 Heparin Dosing Weight: 97 kg  Vital Signs: Temp: 97.7 F (36.5 C) (09/17 0557) Temp Source: Oral (09/17 0557) BP: 100/39 (09/17 0557) Pulse Rate: 60 (09/17 0557)  Labs:  Recent Labs  08/19/16 1046  08/20/16 0152 08/20/16 0759 08/20/16 1247 08/20/16 1537 08/21/16 0311  HGB 12.2*  --   --  11.5*  --   --  10.4*  HCT 36.8*  --   --  36.6*  --   --  32.6*  PLT 162  --   --  161  --   --  142*  HEPARINUNFRC  --   < >  --  0.35  --  0.54 1.60*  CREATININE 1.53*  --   --  1.52*  --   --  1.73*  TROPONINI  --   < > 0.63* 0.74* 0.62*  --   --   < > = values in this interval not displayed.  Estimated Creatinine Clearance: 37.6 mL/min (by C-G formula based on SCr of 1.73 mg/dL (H)).  . heparin 1,350 Units/hr (08/20/16 1556)    Assessment: 82 yo man with CP, elevated troponins on IV heparin. - 9/17: AM HL 1.6 high , Hgb 12.2>11.5>10.4. Plts ok.  Goal of Therapy:  Heparin level 0.3-0.7 units/ml Monitor platelets by anticoagulation protocol: Yes   Plan:  Hold IV heparin x 1 hr, then decrease to 1050 units/hr Recheck HL 6 hrs after heparin resumed Daily HL and CBC while on heparin Monitor for bleeding complications   Brihanna Devenport S. Merilynn Finlandobertson, PharmD, [facility] CenterBCPS Clinical Staff Pharmacist Pager [phone]  08/21/2016 6:41 AM

## 2016-08-21 NOTE — Progress Notes (Signed)
PROGRESS NOTE    Tim GratesJohn A Cooperwood Jr.  [id number] DOB: 1928-11-10 DOA: 08/19/2016 PCP: Ignatius Speckinghruv B Vyas, MD   Brief Narrative: Tim GratesJohn A Chrisman Jr. is a 82 y.o. male with a history of diet-controlled diabetes, GERD, hypertension. Patient seen emergency department for left lower quadrant discomfort for 1 week, but worse last night. The discomfort is vague and is intermittent. Pain is resolved now. Additionally, the patient has several months of burning of her chest pain, nonexertional in nature.  Some radiation and jaw and shoulder. Pain last for approximately 30 minutes and then is resolved. Occurs several times a week, but not daily. Takes bicarbonate, Tums which helps with the discomfort occasionally.  Course in emergency department: CT of the abdomen is normal. Patient had elevated troponins: 0.72 and 0.78 3 hours after first draw. Patient had nitroglycerin and aspirin which completely resolved his chest pain.  Assessment & Plan:   Principal Problem:   Chest pain Active Problems:   Elevated troponin   Diabetes mellitus without complication (HCC)   Essential hypertension   Esophageal reflux   Chronic kidney disease (CKD), stage III (moderate)   NSTEMI (non-ST elevated myocardial infarction) (HCC)  1-N-STEMI;  Presents with chest pain, mild elevation of troponin, EKG changes.  Cardiology consulted, plan for cath on Monday.  Continue with heparin GTT.  LDL 95.  On Beta-blocker, aspirin, statin.   chest pain free.   2 Diabetes -  Continue diet control  3 chronic kidney disease stage 3;  Per record last cr at 1.8.  Continue lisinopril and lasix due to mild elevated cr, and in anticipation of Cath.  IV fluids today prior to cath.   4 hypertension Continue lisinopril and lasix due to mild elevated cr, and in anticipation of Cath.   5 GERD Started  Protonix  6-Abdominal pain; CT scan negative for acute pathology.  Pain improved.   DVT prophylaxis: Heparin  Code Status: Full code,   Family Communication: discussed with patient.  Disposition Plan: remain in patient for treatment NSTEMI, and cardiac evaluation.    Consultants:   Cardiology    Procedures:  none  Antimicrobials:  none  Subjective: No chest pain, abdominal pain improved.   Objective: Vitals:   08/20/16 0447 08/20/16 1447 08/20/16 1950 08/21/16 0557  BP: (!) 93/49 (!) 94/48 (!) 92/50 (!) 100/39  Pulse:  66 71 60  Resp:  18 18 17   Temp:  97.5 F (36.4 C) 97.3 F (36.3 C) 97.7 F (36.5 C)  TempSrc:  Oral Oral Oral  SpO2:  97% 93% 97%  Weight:      Height:        Intake/Output Summary (Last 24 hours) at 08/21/16 1246 Last data filed at 08/21/16 0900  Gross per 24 hour  Intake           486.83 ml  Output              300 ml  Net           186.83 ml   Filed Weights   08/19/16 1034  Weight: 103.9 kg (229 lb)    Examination:  General exam: Appears calm and comfortable  Respiratory system: Clear to auscultation. Respiratory effort normal. Cardiovascular system: S1 & S2 heard, RRR. No JVD, murmurs, rubs, gallops or clicks. No pedal edema. Gastrointestinal system: Abdomen is nondistended, soft and nontender. No organomegaly or masses felt. Normal bowel sounds heard. Central nervous system: Alert and oriented. No focal neurological deficits. Extremities: Symmetric 5 x  5 power. Skin: No rashes, lesions or ulcers Psychiatry: Judgement and insight appear normal. Mood & affect appropriate.     Data Reviewed: I have personally reviewed following labs and imaging studies  CBC:  Recent Labs Lab 08/19/16 1046 08/20/16 0759 08/21/16 0311  WBC 7.1 6.2 6.7  HGB 12.2* 11.5* 10.4*  HCT 36.8* 36.6* 32.6*  MCV 92.0 92.9 92.6  PLT 162 161 142*   Basic Metabolic Panel:  Recent Labs Lab 08/19/16 1046 08/20/16 0759 08/21/16 0311  NA 142 138 140  K 4.3 4.1 3.6  CL 107 107 108  CO2 27 20* 25  GLUCOSE 117* 161* 164*  BUN 22* 19 23*  CREATININE 1.53* 1.52* 1.73*  CALCIUM 9.2  8.8* 8.3*   GFR: Estimated Creatinine Clearance: 37.6 mL/min (by C-G formula based on SCr of 1.73 mg/dL (H)). Liver Function Tests:  Recent Labs Lab 08/19/16 1046  AST 19  ALT 11*  ALKPHOS 41  BILITOT 0.7  PROT 6.8  ALBUMIN 3.9    Recent Labs Lab 08/19/16 1046  LIPASE 29   No results for input(s): AMMONIA in the last 168 hours. Coagulation Profile: No results for input(s): INR, PROTIME in the last 168 hours. Cardiac Enzymes:  Recent Labs Lab 08/19/16 1446 08/19/16 1750 08/20/16 0152 08/20/16 0759 08/20/16 1247  TROPONINI 0.78* 0.81* 0.63* 0.74* 0.62*   BNP (last 3 results) No results for input(s): PROBNP in the last 8760 hours. HbA1C:  Recent Labs  08/19/16 1046  HGBA1C 6.2*   CBG: No results for input(s): GLUCAP in the last 168 hours. Lipid Profile:  Recent Labs  08/20/16 0153  CHOL 142  HDL 30*  LDLCALC 95  TRIG 85  CHOLHDL 4.7   Thyroid Function Tests: No results for input(s): TSH, T4TOTAL, FREET4, T3FREE, THYROIDAB in the last 72 hours. Anemia Panel: No results for input(s): VITAMINB12, FOLATE, FERRITIN, TIBC, IRON, RETICCTPCT in the last 72 hours. Sepsis Labs: No results for input(s): PROCALCITON, LATICACIDVEN in the last 168 hours.  No results found for this or any previous visit (from the past 240 hour(s)).       Radiology Studies: Ct Abdomen Pelvis Wo Contrast  Result Date: 08/19/2016 CLINICAL DATA:  Acute abdominal and pelvic pain for 1 day. EXAM: CT ABDOMEN AND PELVIS WITHOUT CONTRAST TECHNIQUE: Multidetector CT imaging of the abdomen and pelvis was performed following the standard protocol without IV contrast. COMPARISON:  None. FINDINGS: Please note that parenchymal abnormalities may be missed without intravenous contrast. Lower chest: Small bilateral pleural effusions, small pericardial effusion and mild bibasilar atelectasis noted. Cardiomegaly and coronary artery calcifications identified. Hepatobiliary: Unremarkable. The  patient is status post cholecystectomy. There is no evidence of biliary dilatation. Pancreas: Unremarkable. Spleen: Unremarkable Adrenals/Urinary Tract: Moderate to severe bilateral renal atrophy identified. There is no evidence of hydronephrosis, definite renal mass or urinary calculi. The adrenal glands and bladder are unremarkable. Stomach/Bowel: Colonic diverticulosis noted without evidence of diverticulitis. No bowel obstruction identified. The appendix is normal. Vascular/Lymphatic: Abdominal aortic atherosclerotic calcifications noted without aneurysm. No enlarged lymph nodes identified. Reproductive: Marked prostate enlargement noted. Other: No free fluid, pneumoperitoneum or focal collection. A small left paramedian supraumbilical hernia containing fat is noted. Musculoskeletal: Degenerative changes within the lumbar spine identified. No acute or suspicious abnormality noted. IMPRESSION: Small bilateral pleural effusions, small pericardial effusion and mild bibasilar atelectasis. No evidence of acute abnormality within the abdomen or pelvis. Abdominal aortic atherosclerosis. Cardiomegaly and coronary disease. Renal atrophy and marked prostamegaly. Electronically Signed   By: Harmon Pier  M.D.   On: 08/19/2016 14:47        Scheduled Meds: . aspirin EC  325 mg Oral Daily  . atorvastatin  40 mg Oral q1800  . clopidogrel  75 mg Oral Daily  . finasteride  5 mg Oral Daily  . metoprolol tartrate  25 mg Oral BID  . pantoprazole  40 mg Oral Daily  . terazosin  5 mg Oral Daily   Continuous Infusions: . sodium chloride 100 mL/hr at 08/21/16 1024  . heparin 1,050 Units/hr (08/21/16 1029)     LOS: 2 days    Time spent: 35 minutes.     Alba Cory, MD [facility] Pager [phone]  If 7PM-7AM, please contact night-coverage [email or web address] Password [facility] 08/21/2016, 12:46 PM

## 2016-08-21 NOTE — Progress Notes (Signed)
ANTICOAGULATION CONSULT NOTE   Pharmacy Consult for heparin Indication: chest pain/ACS  No Known Allergies  Patient Measurements: Height: 5\' 11"  (180.3 cm) Weight: 229 lb (103.9 kg) IBW/kg (Calculated) : 75.3 Heparin Dosing Weight: 97 kg  Vital Signs: Temp: 97.5 F (36.4 C) (09/17 1451) Temp Source: Oral (09/17 1451) BP: 98/54 (09/17 1451) Pulse Rate: 61 (09/17 1451)  Labs:  Recent Labs  08/19/16 1046  08/20/16 0152 08/20/16 0759 08/20/16 1247 08/20/16 1537 08/21/16 0311 08/21/16 1344  HGB 12.2*  --   --  11.5*  --   --  10.4*  --   HCT 36.8*  --   --  36.6*  --   --  32.6*  --   PLT 162  --   --  161  --   --  142*  --   HEPARINUNFRC  --   < >  --  0.35  --  0.54 1.60* 1.15*  CREATININE 1.53*  --   --  1.52*  --   --  1.73*  --   TROPONINI  --   < > 0.63* 0.74* 0.62*  --   --   --   < > = values in this interval not displayed.  Estimated Creatinine Clearance: 37.6 mL/min (by C-G formula based on SCr of 1.73 mg/dL (H)).  . heparin      Assessment: 82 yo man with CP, elevated troponins on IV heparin.  Heparin level elevated overnight, drip held and then rate reduced.  F/u heparin level remains high on heparin 1050 units/hr.  Spoke with pt and RN, level drawn appropriately from opposite arm.  No bleeding or complications noted.  Goal of Therapy:  Heparin level 0.3-0.7 units/ml Monitor platelets by anticoagulation protocol: Yes   Plan:  Hold IV heparin x 1 hr, then decrease to 850 units/hr Recheck HL 8 hrs after heparin resumed Daily HL and CBC while on heparin Monitor for bleeding complications  Tad MooreJessica Sugar Vanzandt, Pharm D, BCPS  Clinical Pharmacist Pager [phone]  08/21/2016 3:50 PM

## 2016-08-22 DIAGNOSIS — N289 Disorder of kidney and ureter, unspecified: Secondary | ICD-10-CM

## 2016-08-22 DIAGNOSIS — N183 Chronic kidney disease, stage 3 (moderate): Secondary | ICD-10-CM

## 2016-08-22 DIAGNOSIS — E119 Type 2 diabetes mellitus without complications: Secondary | ICD-10-CM

## 2016-08-22 DIAGNOSIS — R0789 Other chest pain: Secondary | ICD-10-CM

## 2016-08-22 LAB — HEPARIN LEVEL (UNFRACTIONATED)
Heparin Unfractionated: 0.75 IU/mL — ABNORMAL HIGH (ref 0.30–0.70)
Heparin Unfractionated: 0.74 IU/mL — ABNORMAL HIGH (ref 0.30–0.70)
Heparin Unfractionated: 0.28 IU/mL — ABNORMAL LOW (ref 0.30–0.70)

## 2016-08-22 LAB — CBC
HEMATOCRIT: 32.9 % — AB (ref 39.0–52.0)
HEMOGLOBIN: 10.7 g/dL — AB (ref 13.0–17.0)
MCH: 29.8 pg (ref 26.0–34.0)
MCHC: 32.5 g/dL (ref 30.0–36.0)
MCV: 91.6 fL (ref 78.0–100.0)
Platelets: 130 10*3/uL — ABNORMAL LOW (ref 150–400)
RBC: 3.59 MIL/uL — AB (ref 4.22–5.81)
RDW: 14.1 % (ref 11.5–15.5)
WBC: 7.2 10*3/uL (ref 4.0–10.5)

## 2016-08-22 LAB — BASIC METABOLIC PANEL
ANION GAP: 7 (ref 5–15)
BUN: 19 mg/dL (ref 6–20)
CHLORIDE: 109 mmol/L (ref 101–111)
CO2: 23 mmol/L (ref 22–32)
Calcium: 8.3 mg/dL — ABNORMAL LOW (ref 8.9–10.3)
Creatinine, Ser: 1.53 mg/dL — ABNORMAL HIGH (ref 0.61–1.24)
GFR calc Af Amer: 46 mL/min — ABNORMAL LOW (ref >60)
GFR calc non Af Amer: 39 mL/min — ABNORMAL LOW (ref >60)
GLUCOSE: 153 mg/dL — AB (ref 65–99)
POTASSIUM: 3.6 mmol/L (ref 3.5–5.1)
Sodium: 139 mmol/L (ref 135–145)

## 2016-08-22 MED ORDER — SODIUM CHLORIDE 0.9 % IV SOLN: 250.0000 mL | INTRAVENOUS | Status: DC | PRN

## 2016-08-22 MED ORDER — ASPIRIN 81 MG PO CHEW
81.0000 mg | CHEWABLE_TABLET | ORAL | Status: AC
  Administered 2016-08-23: 81 mg via ORAL
  Filled 2016-08-22: qty 1

## 2016-08-22 MED ORDER — SODIUM CHLORIDE 0.9% FLUSH: 3.0000 mL | INTRAVENOUS | Status: DC | PRN

## 2016-08-22 MED ORDER — SODIUM CHLORIDE 0.9 % IV SOLN
INTRAVENOUS | Status: DC
  Administered 2016-08-23: 06:00:00 via INTRAVENOUS

## 2016-08-22 MED ORDER — SODIUM CHLORIDE 0.9% FLUSH
3.0000 mL | Freq: Two times a day (BID) | INTRAVENOUS | Status: DC
  Administered 2016-08-23: 3 mL via INTRAVENOUS

## 2016-08-22 NOTE — Progress Notes (Signed)
PROGRESS NOTE    Tim GratesJohn A Fraleigh Jr.  [id number] DOB: 01-12-1928 DOA: 08/19/2016 PCP: Ignatius Speckinghruv B Vyas, MD   Brief Narrative: Tim GratesJohn A Seawood Jr. is a 82 y.o. male with a history of diet-controlled diabetes, GERD, hypertension. Patient seen emergency department for left lower quadrant discomfort for 1 week, but worse last night. The discomfort is vague and is intermittent. Pain is resolved now. Additionally, the patient has several months of burning of her chest pain, nonexertional in nature.  Some radiation and jaw and shoulder. Pain last for approximately 30 minutes and then is resolved. Occurs several times a week, but not daily. Takes bicarbonate, Tums which helps with the discomfort occasionally.  Course in emergency department: CT of the abdomen is normal. Patient had elevated troponins: 0.72 and 0.78 3 hours after first draw. Patient had nitroglycerin and aspirin which completely resolved his chest pain.  Assessment & Plan:   Principal Problem:   Chest pain Active Problems:   Elevated troponin   Diabetes mellitus without complication (HCC)   Essential hypertension   Esophageal reflux   Chronic kidney disease (CKD), stage III (moderate)   NSTEMI (non-ST elevated myocardial infarction) (HCC)   Renal insufficiency  1-N-STEMI;  Presents with chest pain, mild elevation of troponin, EKG changes.  Cardiology consulted, plan for cath tomorrow  Continue with heparin GTT.  LDL 95.  On Beta-blocker, aspirin, statin.   chest pain free.   2 Diabetes -  Continue diet control  3 chronic kidney disease stage 3;  Per record last cr at 1.8.  Continue lisinopril and lasix due to mild elevated cr, and in anticipation of Cath.  IV fluids today prior to cath.  Cr at 1.5   4 hypertension Continue to hold lisinopril and lasix due to mild elevated cr, and in anticipation of Cath.   5 GERD Started  Protonix  6-Abdominal pain; CT scan negative for acute pathology.  Pain improved.   DVT  prophylaxis: Heparin  Code Status: Full code,  Family Communication: discussed with patient.  Disposition Plan: remain in patient for treatment NSTEMI, and cardiac evaluation.    Consultants:   Cardiology    Procedures:  none  Antimicrobials:  none  Subjective: No chest pain, eating lunch.   Objective: Vitals:   08/21/16 2010 08/22/16 0500 08/22/16 1102 08/22/16 1335  BP: 117/60 116/65 (!) 98/40 (!) 113/53  Pulse: 67 67 60 66  Resp: 18 18  18   Temp: 97.6 F (36.4 C) 97.3 F (36.3 C)  98.4 F (36.9 C)  TempSrc: Oral Oral  Oral  SpO2: 97% 97%  98%  Weight:      Height:        Intake/Output Summary (Last 24 hours) at 08/22/16 1356 Last data filed at 08/22/16 1300  Gross per 24 hour  Intake           726.94 ml  Output                0 ml  Net           726.94 ml   Filed Weights   08/19/16 1034  Weight: 103.9 kg (229 lb)    Examination:  General exam: Appears calm and comfortable  Respiratory system: Clear to auscultation. Respiratory effort normal. Cardiovascular system: S1 & S2 heard, RRR. No JVD, murmurs, rubs, gallops or clicks. No pedal edema. Gastrointestinal system: Abdomen is nondistended, soft and nontender. No organomegaly or masses felt. Normal bowel sounds heard. Central nervous system: Alert and oriented.  No focal neurological deficits. Extremities: Symmetric 5 x 5 power. Skin: No rashes, lesions or ulcers Psychiatry: Judgement and insight appear normal. Mood & affect appropriate.     Data Reviewed: I have personally reviewed following labs and imaging studies  CBC:  Recent Labs Lab 08/19/16 1046 08/20/16 0759 08/21/16 0311 08/22/16 0033  WBC 7.1 6.2 6.7 7.2  HGB 12.2* 11.5* 10.4* 10.7*  HCT 36.8* 36.6* 32.6* 32.9*  MCV 92.0 92.9 92.6 91.6  PLT 162 161 142* 130*   Basic Metabolic Panel:  Recent Labs Lab 08/19/16 1046 08/20/16 0759 08/21/16 0311 08/22/16 0837  NA 142 138 140 139  K 4.3 4.1 3.6 3.6  CL 107 107 108 109  CO2  27 20* 25 23  GLUCOSE 117* 161* 164* 153*  BUN 22* 19 23* 19  CREATININE 1.53* 1.52* 1.73* 1.53*  CALCIUM 9.2 8.8* 8.3* 8.3*   GFR: Estimated Creatinine Clearance: 42.5 mL/min (by C-G formula based on SCr of 1.53 mg/dL (H)). Liver Function Tests:  Recent Labs Lab 08/19/16 1046  AST 19  ALT 11*  ALKPHOS 41  BILITOT 0.7  PROT 6.8  ALBUMIN 3.9    Recent Labs Lab 08/19/16 1046  LIPASE 29   No results for input(s): AMMONIA in the last 168 hours. Coagulation Profile: No results for input(s): INR, PROTIME in the last 168 hours. Cardiac Enzymes:  Recent Labs Lab 08/19/16 1446 08/19/16 1750 08/20/16 0152 08/20/16 0759 08/20/16 1247  TROPONINI 0.78* 0.81* 0.63* 0.74* 0.62*   BNP (last 3 results) No results for input(s): PROBNP in the last 8760 hours. HbA1C: No results for input(s): HGBA1C in the last 72 hours. CBG: No results for input(s): GLUCAP in the last 168 hours. Lipid Profile:  Recent Labs  08/20/16 0153  CHOL 142  HDL 30*  LDLCALC 95  TRIG 85  CHOLHDL 4.7   Thyroid Function Tests: No results for input(s): TSH, T4TOTAL, FREET4, T3FREE, THYROIDAB in the last 72 hours. Anemia Panel: No results for input(s): VITAMINB12, FOLATE, FERRITIN, TIBC, IRON, RETICCTPCT in the last 72 hours. Sepsis Labs: No results for input(s): PROCALCITON, LATICACIDVEN in the last 168 hours.  No results found for this or any previous visit (from the past 240 hour(s)).       Radiology Studies: No results found.      Scheduled Meds: . aspirin EC  325 mg Oral Daily  . atorvastatin  40 mg Oral q1800  . clopidogrel  75 mg Oral Daily  . finasteride  5 mg Oral Daily  . metoprolol tartrate  25 mg Oral BID  . pantoprazole  40 mg Oral Daily  . terazosin  5 mg Oral Daily   Continuous Infusions: . heparin 650 Units/hr (08/22/16 0849)     LOS: 3 days    Time spent: 35 minutes.     Alba Cory, MD [facility] Pager [phone]  If 7PM-7AM, please  contact night-coverage [email or web address] Password [email or web address] 08/22/2016, 1:56 PM

## 2016-08-22 NOTE — Progress Notes (Addendum)
Patient Profile: 4yoM with HTN, diet controlled diabetes, admitted with chest pain. Ruled in for NSTEMI. Echo with reduced EF at 20-25%.  Pt also has renal insuffiencey.   Subjective: Currently CP free. Denies dyspnea.   Objective: Vital signs in last 24 hours: Temp:  [97.3 F (36.3 C)-97.6 F (36.4 C)] 97.3 F (36.3 C) (09/18 0500) Pulse Rate:  [61-67] 67 (09/18 0500) Resp:  [17-18] 18 (09/18 0500) BP: (98-117)/(51-65) 116/65 (09/18 0500) SpO2:  [97 %-98 %] 97 % (09/18 0500) Last BM Date: 08/19/16  Intake/Output from previous day: 09/17 0701 - 09/18 0700 In: 253.8 [P.O.:240; I.V.:13.8] Out: -  Intake/Output this shift: No intake/output data recorded.  Medications Current Facility-Administered Medications  Medication Dose Route Frequency Provider Last Rate Last Dose  . acetaminophen (TYLENOL) tablet 650 mg  650 mg Oral Q4H PRN Rhona Raider Stinson, DO      . aspirin EC tablet 325 mg  325 mg Oral Daily Rhona Raider Stinson, DO   325 mg at 08/21/16 0919  . atorvastatin (LIPITOR) tablet 40 mg  40 mg Oral q1800 Pricilla Riffle, MD   40 mg at 08/21/16 1711  . clopidogrel (PLAVIX) tablet 75 mg  75 mg Oral Daily Dossie Arbour, MD   75 mg at 08/21/16 0919  . finasteride (PROSCAR) tablet 5 mg  5 mg Oral Daily Rhona Raider Stinson, DO   5 mg at 08/21/16 0919  . heparin ADULT infusion 100 units/mL (25000 units/278mL sodium chloride 0.45%)  750 Units/hr Intravenous Continuous Norva Pavlov, RPH 7.5 mL/hr at 08/22/16 0149 750 Units/hr at 08/22/16 0149  . metoprolol tartrate (LOPRESSOR) tablet 25 mg  25 mg Oral BID Pricilla Riffle, MD   25 mg at 08/21/16 2119  . nitroGLYCERIN (NITROSTAT) SL tablet 0.4 mg  0.4 mg Sublingual Q5 min PRN Samuel Jester, DO   0.4 mg at 08/20/16 0440  . ondansetron (ZOFRAN) injection 4 mg  4 mg Intravenous Q6H PRN Rhona Raider Stinson, DO      . pantoprazole (PROTONIX) EC tablet 40 mg  40 mg Oral Daily Jodelle Gross, NP   40 mg at 08/21/16 0919  . terazosin (HYTRIN) capsule  5 mg  5 mg Oral Daily Rhona Raider Stinson, DO   5 mg at 08/21/16 1610  . zolpidem (AMBIEN) tablet 5 mg  5 mg Oral QHS PRN Levie Heritage, DO        PE: General appearance: alert, cooperative, no distress and moderately obese Neck: no carotid bruit and no JVD Lungs: faint rales in RLL Heart: regular rate and rhythm Extremities: 2+ bilateral LEE pitting edema Pulses: 2+ and symmetric Skin: warm and dry Neurologic: Grossly normal  Lab Results:   Recent Labs  08/20/16 0759 08/21/16 0311 08/22/16 0033  WBC 6.2 6.7 7.2  HGB 11.5* 10.4* 10.7*  HCT 36.6* 32.6* 32.9*  PLT 161 142* 130*   BMET  Recent Labs  08/19/16 1046 08/20/16 0759 08/21/16 0311  NA 142 138 140  K 4.3 4.1 3.6  CL 107 107 108  CO2 27 20* 25  GLUCOSE 117* 161* 164*  BUN 22* 19 23*  CREATININE 1.53* 1.52* 1.73*  CALCIUM 9.2 8.8* 8.3*   PT/INR No results for input(s): LABPROT, INR in the last 72 hours. Cholesterol  Recent Labs  08/20/16 0153  CHOL 142   Cardiac Panel (last 3 results)  Recent Labs  08/20/16 0152 08/20/16 0759 08/20/16 1247  TROPONINI 0.63* 0.74* 0.62*     Studies/Results: 2D  Echo 08/21/16 Study Conclusions  - Left ventricle: The cavity size was normal. Wall thickness was   increased in a pattern of moderate LVH. Systolic function was   severely reduced. The estimated ejection fraction was in the   range of 20% to 25%. There is akinesis of the   mid-apicalanteroseptal myocardium. Features are consistent with a   pseudonormal left ventricular filling pattern, with concomitant   abnormal relaxation and increased filling pressure (grade 2   diastolic dysfunction). - Aortic valve: Valve mobility was restricted. There was mild   stenosis. There was mild regurgitation. Peak velocity (S): 211   cm/s. Mean gradient (S): 9 mm Hg. - Mitral valve: There was moderate regurgitation. - Left atrium: The atrium was moderately dilated. - Pulmonary arteries: Systolic pressure was  moderately increased.   PA peak pressure: 57 mm Hg (S).   Assessment/Plan  Principal Problem:   Chest pain Active Problems:   Elevated troponin   Diabetes mellitus without complication (HCC)   Essential hypertension   Esophageal reflux   Chronic kidney disease (CKD), stage III (moderate)   NSTEMI (non-ST elevated myocardial infarction) (HCC)   1. NSTEMI: Currently CP free. Troponin peaked at 0.74. Echo shows reduced LVEF of 20-25%. Plan on definative cath +/-PCI, possibly today or tomorrow depending on renal function. BMP pending. SCr yesterday was 1.73.   2. Systolic HF: Acute systolic dysfunction in setting of SEMI  EF 20-25% on echo. Plan for LHC this admit to r/o ischemic etiology. Currently on Lopressor. Given LV dysfunction, will need to change to either Toprol XL or Coreg. No ACE/ARB currently given renal insuffiencey. If BP allows, can later add hydralazine + nitrate. Low sodium diet. Monitor volume status.   3. DM: per IM.   4. HTN: BP is currently well controlled on BB. Change to Toprol XL vs Coreg as outlined above.    LOS: 3 days    Brittainy M. Delmer IslamSimmons, PA-C 08/22/2016 7:42 AM   Patient examined chart reviewed. Awaiting f/u Cr wold consider doing right and left  Cath so long as Cr under 2 for diagnostic purposes. Exam remarkable for clear lungs And no murmur Increased PMI.  Patient wants to go home as wife is not feeling well  Charlton HawsPeter Hank Walling

## 2016-08-22 NOTE — Progress Notes (Signed)
ANTICOAGULATION CONSULT NOTE - Follow Up Consult  Pharmacy Consult for heparin Indication: chest pain/ACS  No Known Allergies  Patient Measurements: Height: 5\' 11"  (180.3 cm) Weight: 229 lb (103.9 kg) IBW/kg (Calculated) : 75.3 Heparin Dosing Weight: 97 kg  Vital Signs: Temp: 98.4 F (36.9 C) (09/18 1335) Temp Source: Oral (09/18 1335) BP: 113/53 (09/18 1335) Pulse Rate: 66 (09/18 1335)  Labs:  Recent Labs  08/20/16 0152  08/20/16 0759 08/20/16 1247  08/21/16 0311  08/22/16 0033 08/22/16 0525 08/22/16 0837 08/22/16 1701  HGB  --   < > 11.5*  --   --  10.4*  --  10.7*  --   --   --   HCT  --   --  36.6*  --   --  32.6*  --  32.9*  --   --   --   PLT  --   --  161  --   --  142*  --  130*  --   --   --   HEPARINUNFRC  --   --  0.35  --   < > 1.60*  < > 0.75* 0.74*  --  0.28*  CREATININE  --   --  1.52*  --   --  1.73*  --   --   --  1.53*  --   TROPONINI 0.63*  --  0.74* 0.62*  --   --   --   --   --   --   --   < > = values in this interval not displayed.  Estimated Creatinine Clearance: 42.5 mL/min (by C-G formula based on SCr of 1.53 mg/dL (H)).   Medications:  Infusions:  . heparin 650 Units/hr (08/22/16 0849)    Assessment: 82 yo man with CP, elevated troponins on IV heparin drip.  Heparin level is now just below goal at 0.28 after multiple decreases for supratherapeutic levels - was also supratherapeutic on 750 units/hr. No bleeding noted. Plan is for cath tomorrow.   Goal of Therapy:  Heparin level 0.3-0.7 units/ml Monitor platelets by anticoagulation protocol: Yes   Plan:  - Increase heparin drip to 700 units/hr - Daily heparin level and CBC - Monitor for s/sx of bleeding   Loura BackJennifer Callaway, PharmD, BCPS Clinical Pharmacist Phone for today [phone] Main pharmacy - [phone] 08/22/2016 6:12 PM

## 2016-08-22 NOTE — Care Management Important Message (Signed)
Important Message  Patient Details  Name: Tim GratesJohn A Corwin Jr. MRN: [id number] Date of Birth: 1928/09/18   Medicare Important Message Given:  Yes    [name] 08/22/2016, 9:45 AM

## 2016-08-22 NOTE — Progress Notes (Signed)
ANTICOAGULATION CONSULT NOTE   Pharmacy Consult for heparin Indication: chest pain/ACS  No Known Allergies  Patient Measurements: Height: 5\' 11"  (180.3 cm) Weight: 229 lb (103.9 kg) IBW/kg (Calculated) : 75.3 Heparin Dosing Weight: 97 kg  Vital Signs: Temp: 97.3 F (36.3 C) (09/18 0500) Temp Source: Oral (09/18 0500) BP: 116/65 (09/18 0500) Pulse Rate: 67 (09/18 0500)  Labs:  Recent Labs  08/19/16 1046  08/20/16 0152 08/20/16 0759 08/20/16 1247  08/21/16 0311 08/21/16 1344 08/22/16 0033 08/22/16 0525  HGB 12.2*  --   --  11.5*  --   --  10.4*  --  10.7*  --   HCT 36.8*  --   --  36.6*  --   --  32.6*  --  32.9*  --   PLT 162  --   --  161  --   --  142*  --  130*  --   HEPARINUNFRC  --   < >  --  0.35  --   < > 1.60* 1.15* 0.75* 0.74*  CREATININE 1.53*  --   --  1.52*  --   --  1.73*  --   --   --   TROPONINI  --   < > 0.63* 0.74* 0.62*  --   --   --   --   --   < > = values in this interval not displayed.  Estimated Creatinine Clearance: 37.6 mL/min (by C-G formula based on SCr of 1.73 mg/dL (H)).  . heparin 750 Units/hr (08/22/16 0149)    Assessment: 82 yo man with CP, elevated troponins on IV heparin.  Heparin level slightly elevated overnight after multiple rate decreases. Spoke with pt and RN, level drawn appropriately from opposite arm. No bleeding or complications noted. Hg low stable, plt down to 130. Possibly for cath Monday or Tuesday.  Goal of Therapy:  Heparin level 0.3-0.7 units/ml Monitor platelets by anticoagulation protocol: Yes   Plan:  Decrease heparin IV to 650 units/hr 8h heparin level Daily heparin level and CBC while on heparin Monitor for bleeding complications F/u plans for cath  Babs BertinHaley Gigi Onstad, PharmD, [facility] Clinical Pharmacist Pager [phone] 08/22/2016 8:34 AM

## 2016-08-23 ENCOUNTER — Inpatient Hospital Stay (HOSPITAL_COMMUNITY): Payer: Medicare Other

## 2016-08-23 ENCOUNTER — Encounter (HOSPITAL_COMMUNITY)
Admission: EM | Disposition: A | Discharge status: Skilled Nursing Facility | Payer: Self-pay | Source: Home / Self Care | Attending: Internal Medicine

## 2016-08-23 DIAGNOSIS — I2510 Atherosclerotic heart disease of native coronary artery without angina pectoris: Secondary | ICD-10-CM

## 2016-08-23 DIAGNOSIS — I25110 Atherosclerotic heart disease of native coronary artery with unstable angina pectoris: Secondary | ICD-10-CM

## 2016-08-23 DIAGNOSIS — I255 Ischemic cardiomyopathy: Secondary | ICD-10-CM

## 2016-08-23 DIAGNOSIS — I350 Nonrheumatic aortic (valve) stenosis: Secondary | ICD-10-CM

## 2016-08-23 DIAGNOSIS — I5021 Acute systolic (congestive) heart failure: Secondary | ICD-10-CM

## 2016-08-23 DIAGNOSIS — R570 Cardiogenic shock: Secondary | ICD-10-CM

## 2016-08-23 HISTORY — PX: CARDIAC CATHETERIZATION: SHX172

## 2016-08-23 LAB — POCT I-STAT 3, VENOUS BLOOD GAS (G3P V)
ACID-BASE DEFICIT: 4 mmol/L — AB (ref 0.0–2.0)
ACID-BASE DEFICIT: 5 mmol/L — AB (ref 0.0–2.0)
BICARBONATE: 20.3 mmol/L (ref 20.0–28.0)
BICARBONATE: 21 mmol/L (ref 20.0–28.0)
O2 SAT: 52 %
O2 Saturation: 55 %
PCO2 VEN: 36.7 mmHg — AB (ref 44.0–60.0)
PO2 VEN: 28 mmHg — AB (ref 32.0–45.0)
PO2 VEN: 30 mmHg — AB (ref 32.0–45.0)
TCO2: 21 mmol/L (ref 0–100)
TCO2: 22 mmol/L (ref 0–100)
pCO2, Ven: 36.2 mmHg — ABNORMAL LOW (ref 44.0–60.0)
pH, Ven: 7.356 (ref 7.250–7.430)
pH, Ven: 7.365 (ref 7.250–7.430)

## 2016-08-23 LAB — CBC
HCT: 28.3 % — ABNORMAL LOW (ref 39.0–52.0)
HEMOGLOBIN: 9.1 g/dL — AB (ref 13.0–17.0)
MCH: 29.6 pg (ref 26.0–34.0)
MCHC: 32.2 g/dL (ref 30.0–36.0)
MCV: 92.2 fL (ref 78.0–100.0)
Platelets: 143 10*3/uL — ABNORMAL LOW (ref 150–400)
RBC: 3.07 MIL/uL — AB (ref 4.22–5.81)
RDW: 13.8 % (ref 11.5–15.5)
WBC: 6.8 10*3/uL (ref 4.0–10.5)

## 2016-08-23 LAB — HEPARIN LEVEL (UNFRACTIONATED): HEPARIN UNFRACTIONATED: 0.34 [IU]/mL (ref 0.30–0.70)

## 2016-08-23 LAB — BASIC METABOLIC PANEL
ANION GAP: 7 (ref 5–15)
BUN: 21 mg/dL — ABNORMAL HIGH (ref 6–20)
CO2: 23 mmol/L (ref 22–32)
Calcium: 8 mg/dL — ABNORMAL LOW (ref 8.9–10.3)
Chloride: 109 mmol/L (ref 101–111)
Creatinine, Ser: 1.67 mg/dL — ABNORMAL HIGH (ref 0.61–1.24)
GFR, EST AFRICAN AMERICAN: 41 mL/min — AB (ref >60)
GFR, EST NON AFRICAN AMERICAN: 35 mL/min — AB (ref >60)
GLUCOSE: 151 mg/dL — AB (ref 65–99)
POTASSIUM: 3.8 mmol/L (ref 3.5–5.1)
SODIUM: 139 mmol/L (ref 135–145)

## 2016-08-23 LAB — GLUCOSE, CAPILLARY: Glucose-Capillary: 108 mg/dL — ABNORMAL HIGH (ref 65–99)

## 2016-08-23 LAB — PROTIME-INR
INR: 1.28
Prothrombin Time: 16.1 seconds — ABNORMAL HIGH (ref 11.4–15.2)

## 2016-08-23 SURGERY — LEFT HEART CATH AND CORONARY ANGIOGRAPHY
Anesthesia: LOCAL

## 2016-08-23 MED ORDER — SODIUM CHLORIDE 0.9 % WEIGHT BASED INFUSION: 1.0000 mL/kg/h | INTRAVENOUS | Status: AC

## 2016-08-23 MED ORDER — HEPARIN SODIUM (PORCINE) 1000 UNIT/ML IJ SOLN
INTRAMUSCULAR | Status: DC | PRN
  Administered 2016-08-23: 5000 [IU] via INTRAVENOUS

## 2016-08-23 MED ORDER — LIDOCAINE HCL (PF) 1 % IJ SOLN
INTRAMUSCULAR | Status: DC | PRN
  Administered 2016-08-23: 10 mL

## 2016-08-23 MED ORDER — VERAPAMIL HCL 2.5 MG/ML IV SOLN
INTRAVENOUS | Status: AC
  Filled 2016-08-23: qty 2

## 2016-08-23 MED ORDER — SODIUM CHLORIDE 0.9 % IV SOLN
INTRAVENOUS | Status: DC
  Administered 2016-08-29: 11:00:00 via INTRAVENOUS

## 2016-08-23 MED ORDER — HEPARIN (PORCINE) IN NACL 100-0.45 UNIT/ML-% IJ SOLN
700.0000 [IU]/h | INTRAMUSCULAR | Status: DC
  Administered 2016-08-23: 700 [IU]/h via INTRAVENOUS
  Filled 2016-08-23: qty 250

## 2016-08-23 MED ORDER — HEPARIN SODIUM (PORCINE) 1000 UNIT/ML IJ SOLN
INTRAMUSCULAR | Status: AC
  Filled 2016-08-23: qty 1

## 2016-08-23 MED ORDER — IOPAMIDOL (ISOVUE-370) INJECTION 76%
INTRAVENOUS | Status: DC | PRN
  Administered 2016-08-23: 65 mL via INTRA_ARTERIAL

## 2016-08-23 MED ORDER — LIDOCAINE HCL (PF) 1 % IJ SOLN
INTRAMUSCULAR | Status: AC
Start: 2016-08-23 — End: 2016-08-23
  Filled 2016-08-23: qty 30

## 2016-08-23 MED ORDER — SODIUM CHLORIDE 0.9 % IV SOLN: 250.0000 mL | INTRAVENOUS | Status: DC | PRN

## 2016-08-23 MED ORDER — SODIUM CHLORIDE 0.9% FLUSH: 3.0000 mL | INTRAVENOUS | Status: DC | PRN

## 2016-08-23 MED ORDER — HEPARIN (PORCINE) IN NACL 2-0.9 UNIT/ML-% IJ SOLN
INTRAMUSCULAR | Status: AC
  Filled 2016-08-23: qty 1000

## 2016-08-23 MED ORDER — LIDOCAINE HCL (PF) 1 % IJ SOLN
INTRAMUSCULAR | Status: DC | PRN
  Administered 2016-08-23: 15 mL
  Administered 2016-08-23: 4 mL

## 2016-08-23 MED ORDER — SODIUM CHLORIDE 0.9% FLUSH
3.0000 mL | Freq: Two times a day (BID) | INTRAVENOUS | Status: DC
  Administered 2016-08-24 – 2016-08-28 (×6): 3 mL via INTRAVENOUS

## 2016-08-23 MED ORDER — VERAPAMIL HCL 2.5 MG/ML IV SOLN
INTRAVENOUS | Status: DC | PRN
  Administered 2016-08-23: 8 mL via INTRA_ARTERIAL

## 2016-08-23 MED ORDER — HEPARIN (PORCINE) IN NACL 2-0.9 UNIT/ML-% IJ SOLN
INTRAMUSCULAR | Status: DC | PRN
  Administered 2016-08-23: 1500 mL

## 2016-08-23 MED ORDER — IOPAMIDOL (ISOVUE-370) INJECTION 76%
INTRAVENOUS | Status: AC
  Filled 2016-08-23: qty 100

## 2016-08-23 SURGICAL SUPPLY — 17 items
BALLN LINEAR 7.5FR IABP 40CC (BALLOONS) ×2
BALLOON LINEAR 7.5FR IABP 40CC (BALLOONS) ×1 IMPLANT
CATH INFINITI 5 FR JL3.5 (CATHETERS) ×2 IMPLANT
CATH INFINITI 5FR ANG PIGTAIL (CATHETERS) ×2 IMPLANT
CATH INFINITI JR4 5F (CATHETERS) ×2 IMPLANT
CATH SWAN GANZ VIP 7.5F (CATHETERS) ×2 IMPLANT
DEVICE RAD COMP TR BAND LRG (VASCULAR PRODUCTS) ×2 IMPLANT
DEVICE SECURE STATLOCK IABP (MISCELLANEOUS) ×4 IMPLANT
GLIDESHEATH SLEND A-KIT 6F 22G (SHEATH) ×2 IMPLANT
KIT HEART LEFT (KITS) ×2 IMPLANT
PACK CARDIAC CATHETERIZATION (CUSTOM PROCEDURE TRAY) ×2 IMPLANT
SHEATH PINNACLE 5F 10CM (SHEATH) ×2 IMPLANT
SHEATH PINNACLE 8F 10CM (SHEATH) ×2 IMPLANT
SLEEVE REPOSITIONING LENGTH 30 (MISCELLANEOUS) ×2 IMPLANT
TRANSDUCER W/STOPCOCK (MISCELLANEOUS) ×2 IMPLANT
TUBING CIL FLEX 10 FLL-RA (TUBING) ×2 IMPLANT
WIRE SAFE-T 1.5MM-J .035X260CM (WIRE) ×2 IMPLANT
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
## 2016-08-23 NOTE — Progress Notes (Signed)
ANTICOAGULATION CONSULT NOTE - Initial Consult  Pharmacy Consult for Heparin Indication: SEMI/NSTEMI with IABP  No Known Allergies  Patient Measurements: Height: 5\' 11"  (180.3 cm) Weight: 227 lb (103 kg) IBW/kg (Calculated) : 75.3  Vital Signs: Temp: 97.7 F (36.5 C) (09/19 2115) Temp Source: Oral (09/19 2057) BP: 124/68 (09/19 2115) Pulse Rate: 80 (09/19 2115)  Labs:  Recent Labs  08/21/16 0311  08/22/16 0033 08/22/16 0525 08/22/16 0837 08/22/16 1701 08/23/16 0218 08/23/16 0445  HGB 10.4*  --  10.7*  --   --   --  9.1*  --   HCT 32.6*  --  32.9*  --   --   --  28.3*  --   PLT 142*  --  130*  --   --   --  143*  --   LABPROT  --   --   --   --   --   --   --  16.1*  INR  --   --   --   --   --   --   --  1.28  HEPARINUNFRC 1.60*  < > 0.75* 0.74*  --  0.28* 0.34  --   CREATININE 1.73*  --   --   --  1.53*  --  1.67*  --   < > = values in this interval not displayed.  Estimated Creatinine Clearance: 38.8 mL/min (by C-G formula based on SCr of 1.67 mg/dL (H)).   Medical History: Past Medical History:  Diagnosis Date  . Arthritis   . Diabetes mellitus without complication (HCC)   . GERD (gastroesophageal reflux disease)    depending on diet  . Hypertension     Medications:  Scheduled:  . aspirin EC  325 mg Oral Daily  . atorvastatin  40 mg Oral q1800  . finasteride  5 mg Oral Daily  . metoprolol tartrate  25 mg Oral BID  . pantoprazole  40 mg Oral Daily  . sodium chloride flush  3 mL Intravenous Q12H  . terazosin  5 mg Oral Daily    Assessment: 82yo male s/p PCI with IABP and TR Band in place, to resume heparin at reduced rate for balloon pump and then increase to full AC goal once TR Band removed.  Pt had been bleeding from meatus and groin site but this is now resolved.  Heparin level was previously therapeutic on 700 units/hr with a heparin level of 0.34  Goal of Therapy:  Heparin level 0.2-0.5 units/ml, then change to 0.3-0.7 once TR Band  out Monitor platelets by anticoagulation protocol: Yes   Plan:  Resume Heparin 700 units/hr, no bolus Heparin level in 8hr Daily HL, CBC Watch for s/s of bleeding   Marisue HumbleKendra Carden Teel, PharmD Clinical Pharmacist [facility]

## 2016-08-23 NOTE — H&P (View-Only) (Signed)
  Patient Profile: 88yoM with HTN, diet controlled diabetes, admitted with chest pain. Ruled in for NSTEMI. Echo with reduced EF at 20-25%.  Pt also has renal insuffiencey.   Subjective: Currently CP free. Denies dyspnea.   Objective: Vital signs in last 24 hours: Temp:  [98.2 F (36.8 C)-98.4 F (36.9 C)] 98.2 F (36.8 C) (09/19 0404) Pulse Rate:  [60-68] 66 (09/19 0409) Resp:  [17-18] 17 (09/19 0404) BP: (98-116)/(40-60) 110/52 (09/19 0409) SpO2:  [90 %-98 %] 90 % (09/19 0409) Weight:  [103 kg (227 lb)] 103 kg (227 lb) (09/19 0541) Last BM Date: 08/19/16  Intake/Output from previous day: 09/18 0701 - 09/19 0700 In: 720 [P.O.:720] Out: -  Intake/Output this shift: No intake/output data recorded.  Medications Current Facility-Administered Medications  Medication Dose Route Frequency Provider Last Rate Last Dose  . 0.9 %  sodium chloride infusion  250 mL Intravenous PRN Brittainy M Simmons, PA-C      . 0.9 %  sodium chloride infusion   Intravenous Continuous Brittainy M Simmons, PA-C 10 mL/hr at 08/23/16 0542    . acetaminophen (TYLENOL) tablet 650 mg  650 mg Oral Q4H PRN Jacob J Stinson, DO   650 mg at 08/22/16 2343  . aspirin EC tablet 325 mg  325 mg Oral Daily Jacob J Stinson, DO   325 mg at 08/22/16 1102  . atorvastatin (LIPITOR) tablet 40 mg  40 mg Oral q1800 Paula V Ross, MD   40 mg at 08/22/16 1802  . clopidogrel (PLAVIX) tablet 75 mg  75 mg Oral Daily Edward Sze, MD   75 mg at 08/22/16 1103  . finasteride (PROSCAR) tablet 5 mg  5 mg Oral Daily Jacob J Stinson, DO   5 mg at 08/22/16 1102  . heparin ADULT infusion 100 units/mL (25000 units/250mL sodium chloride 0.45%)  700 Units/hr Intravenous Continuous Jennifer D Oneida Castle, RPH 7 mL/hr at 08/22/16 2347 700 Units/hr at 08/22/16 2347  . metoprolol tartrate (LOPRESSOR) tablet 25 mg  25 mg Oral BID Paula V Ross, MD   25 mg at 08/22/16 2147  . ondansetron (ZOFRAN) injection 4 mg  4 mg Intravenous Q6H PRN Jacob J Stinson, DO       . pantoprazole (PROTONIX) EC tablet 40 mg  40 mg Oral Daily Kathryn M Lawrence, NP   40 mg at 08/22/16 1102  . sodium chloride flush (NS) 0.9 % injection 3 mL  3 mL Intravenous Q12H Brittainy M Simmons, PA-C      . sodium chloride flush (NS) 0.9 % injection 3 mL  3 mL Intravenous PRN Brittainy M Simmons, PA-C      . terazosin (HYTRIN) capsule 5 mg  5 mg Oral Daily Jacob J Stinson, DO   5 mg at 08/21/16 0919  . zolpidem (AMBIEN) tablet 5 mg  5 mg Oral QHS PRN Jacob J Stinson, DO        PE: General appearance: alert, cooperative, no distress and moderately obese Neck: no carotid bruit and no JVD Lungs: faint rales in RLL Heart: regular rate and rhythm Extremities: 2+ bilateral LEE pitting edema Pulses: 2+ and symmetric Skin: warm and dry Neurologic: Grossly normal  Lab Results:   Recent Labs  08/21/16 0311 08/22/16 0033 08/23/16 0218  WBC 6.7 7.2 6.8  HGB 10.4* 10.7* 9.1*  HCT 32.6* 32.9* 28.3*  PLT 142* 130* 143*   BMET  Recent Labs  08/21/16 0311 08/22/16 0837 08/23/16 0218  NA 140 139 139  K 3.6 3.6 3.8    CL 108 109 109  CO2 25 23 23  GLUCOSE 164* 153* 151*  BUN 23* 19 21*  CREATININE 1.73* 1.53* 1.67*  CALCIUM 8.3* 8.3* 8.0*   PT/INR  Recent Labs  08/23/16 0445  LABPROT 16.1*  INR 1.28   Cholesterol No results for input(s): CHOL in the last 72 hours. Cardiac Panel (last 3 results)  Recent Labs  08/20/16 0759 08/20/16 1247  TROPONINI 0.74* 0.62*     Studies/Results: 2D Echo 08/21/16 Study Conclusions  - Left ventricle: The cavity size was normal. Wall thickness was   increased in a pattern of moderate LVH. Systolic function was   severely reduced. The estimated ejection fraction was in the   range of 20% to 25%. There is akinesis of the   mid-apicalanteroseptal myocardium. Features are consistent with a   pseudonormal left ventricular filling pattern, with concomitant   abnormal relaxation and increased filling pressure (grade 2    diastolic dysfunction). - Aortic valve: Valve mobility was restricted. There was mild   stenosis. There was mild regurgitation. Peak velocity (S): 211   cm/s. Mean gradient (S): 9 mm Hg. - Mitral valve: There was moderate regurgitation. - Left atrium: The atrium was moderately dilated. - Pulmonary arteries: Systolic pressure was moderately increased.   PA peak pressure: 57 mm Hg (S).   Assessment/Plan  Principal Problem:   Chest pain Active Problems:   Elevated troponin   Diabetes mellitus without complication (HCC)   Essential hypertension   Esophageal reflux   Chronic kidney disease (CKD), stage III (moderate)   NSTEMI (non-ST elevated myocardial infarction) (HCC)   Renal insufficiency   1. NSTEMI: Currently CP free. Troponin peaked at 0.74. Echo shows reduced LVEF of 20-25%. Plan on definative cath +/-PCI, today Cr is stable   Lab Results  Component Value Date   CREATININE 1.67 (H) 08/23/2016   BUN 21 (H) 08/23/2016   NA 139 08/23/2016   K 3.8 08/23/2016   CL 109 08/23/2016   CO2 23 08/23/2016     2. Systolic HF: Acute systolic dysfunction in setting of SEMI  EF 20-25% on echo. Plan for LHC this admit to r/o ischemic etiology. Currently on Lopressor. Given LV dysfunction, will need to change to either Toprol XL or Coreg. No ACE/ARB currently given renal insuffiencey. If BP allows, can later add hydralazine + nitrate. Low sodium diet. Monitor volume status.   3. DM: per IM.   4. HTN: BP is currently well controlled on BB. Change to Toprol XL vs Coreg as outlined above.    LOS: 4 days   Tim Hopkins  

## 2016-08-23 NOTE — Progress Notes (Signed)
Pt complained of CP. EKG performed. Vitals stable. Relieved with 1 tab of nitro. Pt pain free and asymptomatic at the moment. Will monitor closely. MD notified.

## 2016-08-23 NOTE — Interval H&P Note (Signed)
History and Physical Interval Note:  08/23/2016 5:11 PM  Tim A Erskine SquibbHill Jr.  has presented today for surgery, with the diagnosis of SEMI (sub-endocardial MI)/NSTEMI with severely reduced LVEF.    The various methods of treatment have been discussed with the patient and family. After consideration of risks, benefits and other options for treatment, the patient has consented to  Procedure(s): Left Heart Cath and Coronary Angiography (N/A) With Possible Percutaneous Coronary Intervention as a surgical intervention .  The patient's history has been reviewed, patient examined, no change in status, stable for surgery.  I have reviewed the patient's chart and labs.  Questions were answered to the patient's satisfaction.    Cath Lab Visit (complete for each Cath Lab visit)  Clinical Evaluation Leading to the Procedure:   ACS: Yes.    Non-ACS:    Anginal Classification: CCS IV  Anti-ischemic medical therapy: Minimal Therapy (1 class of medications)  Non-Invasive Test Results: High-risk stress test findings: cardiac mortality >3%/year - High Risk Echo  Prior CABG: No previous CABG         Bryan Lemmaavid Harding

## 2016-08-23 NOTE — Progress Notes (Addendum)
PROGRESS NOTE    Tim GratesJohn A Penning Jr.  EAV:[id number] DOB: Nov 18, 1928 DOA: 08/19/2016 PCP: Ignatius Speckinghruv B Vyas, MD   Brief Narrative: Tim GratesJohn A Traywick Jr. is a 82 y.o. male with a history of diet-controlled diabetes, GERD, hypertension. Patient seen emergency department for left lower quadrant discomfort for 1 week, but worse last night. The discomfort is vague and is intermittent. Pain is resolved now. Additionally, the patient has several months of burning of her chest pain, nonexertional in nature.  Some radiation and jaw and shoulder. Pain last for approximately 30 minutes and then is resolved. Occurs several times a week, but not daily. Takes bicarbonate, Tums which helps with the discomfort occasionally.  Course in emergency department: CT of the abdomen is normal. Patient had elevated troponins: 0.72 and 0.78 3 hours after first draw. Patient had nitroglycerin and aspirin which completely resolved his chest pain.  Assessment & Plan:   Principal Problem:   Chest pain Active Problems:   Elevated troponin   Diabetes mellitus without complication (HCC)   Essential hypertension   Esophageal reflux   Chronic kidney disease (CKD), stage III (moderate)   NSTEMI (non-ST elevated myocardial infarction) (HCC)   Renal insufficiency  1-N-STEMI;  Presents with chest pain, mild elevation of troponin, EKG changes.  Cardiology consulted, plan for cath today.  Continue with heparin GTT.  LDL 95.  On Beta-blocker, aspirin, statin.   2 Diabetes -  Continue diet control  3 chronic kidney disease stage 3;  Per record last cr at 1.8.  Continue lisinopril and lasix due to mild elevated cr, and in anticipation of Cath.  IV fluids today prior to cath.  Cr at 1.6  4 hypertension Continue to hold lisinopril and lasix due to mild elevated cr, and in anticipation of Cath.   5 GERD Started  Protonix  6-Abdominal pain; CT scan negative for acute pathology.  Pain improved.   7-LE edema; and complaining of  pain. Check Doppler.   DVT prophylaxis: Heparin  Code Status: Full code,  Family Communication: discussed with patient.  Disposition Plan: remain in patient for treatment NSTEMI, and cardiac evaluation.    Consultants:   Cardiology    Procedures:  none  Antimicrobials:  none  Subjective: No chest pain, awaiting cath.   Objective: Vitals:   08/23/16 0409 08/23/16 0541 08/23/16 1010 08/23/16 1331  BP: (!) 110/52  (!) 117/52 (!) 110/58  Pulse: 66  66 62  Resp:    18  Temp:    97.8 F (36.6 C)  TempSrc:    Oral  SpO2: 90%   95%  Weight:  103 kg (227 lb)    Height:        Intake/Output Summary (Last 24 hours) at 08/23/16 1447 Last data filed at 08/23/16 1300  Gross per 24 hour  Intake              240 ml  Output              701 ml  Net             -461 ml   Filed Weights   08/19/16 1034 08/23/16 0541  Weight: 103.9 kg (229 lb) 103 kg (227 lb)    Examination:  General exam: Appears calm and comfortable  Respiratory system: Clear to auscultation. Respiratory effort normal. Cardiovascular system: S1 & S2 heard, RRR. No JVD, murmurs, rubs, gallops or clicks. No pedal edema. Gastrointestinal system: Abdomen is nondistended, soft and nontender. No organomegaly or masses  felt. Normal bowel sounds heard. Central nervous system: Alert and oriented. No focal neurological deficits. Extremities: Symmetric 5 x 5 power. Skin: No rashes, lesions or ulcers Psychiatry: Judgement and insight appear normal. Mood & affect appropriate.     Data Reviewed: I have personally reviewed following labs and imaging studies  CBC:  Recent Labs Lab 08/19/16 1046 08/20/16 0759 08/21/16 0311 08/22/16 0033 08/23/16 0218  WBC 7.1 6.2 6.7 7.2 6.8  HGB 12.2* 11.5* 10.4* 10.7* 9.1*  HCT 36.8* 36.6* 32.6* 32.9* 28.3*  MCV 92.0 92.9 92.6 91.6 92.2  PLT 162 161 142* 130* 143*   Basic Metabolic Panel:  Recent Labs Lab 08/19/16 1046 08/20/16 0759 08/21/16 0311 08/22/16 0837  08/23/16 0218  NA 142 138 140 139 139  K 4.3 4.1 3.6 3.6 3.8  CL 107 107 108 109 109  CO2 27 20* 25 23 23   GLUCOSE 117* 161* 164* 153* 151*  BUN 22* 19 23* 19 21*  CREATININE 1.53* 1.52* 1.73* 1.53* 1.67*  CALCIUM 9.2 8.8* 8.3* 8.3* 8.0*   GFR: Estimated Creatinine Clearance: 38.8 mL/min (by C-G formula based on SCr of 1.67 mg/dL (H)). Liver Function Tests:  Recent Labs Lab 08/19/16 1046  AST 19  ALT 11*  ALKPHOS 41  BILITOT 0.7  PROT 6.8  ALBUMIN 3.9    Recent Labs Lab 08/19/16 1046  LIPASE 29   No results for input(s): AMMONIA in the last 168 hours. Coagulation Profile:  Recent Labs Lab 08/23/16 0445  INR 1.28   Cardiac Enzymes:  Recent Labs Lab 08/19/16 1446 08/19/16 1750 08/20/16 0152 08/20/16 0759 08/20/16 1247  TROPONINI 0.78* 0.81* 0.63* 0.74* 0.62*   BNP (last 3 results) No results for input(s): PROBNP in the last 8760 hours. HbA1C: No results for input(s): HGBA1C in the last 72 hours. CBG: No results for input(s): GLUCAP in the last 168 hours. Lipid Profile: No results for input(s): CHOL, HDL, LDLCALC, TRIG, CHOLHDL, LDLDIRECT in the last 72 hours. Thyroid Function Tests: No results for input(s): TSH, T4TOTAL, FREET4, T3FREE, THYROIDAB in the last 72 hours. Anemia Panel: No results for input(s): VITAMINB12, FOLATE, FERRITIN, TIBC, IRON, RETICCTPCT in the last 72 hours. Sepsis Labs: No results for input(s): PROCALCITON, LATICACIDVEN in the last 168 hours.  No results found for this or any previous visit (from the past 240 hour(s)).       Radiology Studies: No results found.      Scheduled Meds: . aspirin EC  325 mg Oral Daily  . atorvastatin  40 mg Oral q1800  . clopidogrel  75 mg Oral Daily  . finasteride  5 mg Oral Daily  . metoprolol tartrate  25 mg Oral BID  . pantoprazole  40 mg Oral Daily  . sodium chloride flush  3 mL Intravenous Q12H  . terazosin  5 mg Oral Daily   Continuous Infusions: . sodium chloride 10 mL/hr  at 08/23/16 0542  . heparin 700 Units/hr (08/22/16 2347)     LOS: 4 days    Time spent: 35 minutes.     Alba Cory, MD [facility] Pager [phone]  If 7PM-7AM, please contact night-coverage [email or web address] Password [facility] 08/23/2016, 2:47 PM

## 2016-08-23 NOTE — Progress Notes (Signed)
Orthopedic Tech Progress Note Patient Details:  Tim GratesJohn A Vea Jr. [birth year] [id number]  Ortho Devices Type of Ortho Device: Knee Immobilizer Ortho Device/Splint Location: RLE Ortho Device/Splint Interventions: Ordered, Application   Jennye MoccasinHughes, [name] 08/23/2016, 8:38 PM

## 2016-08-23 NOTE — Progress Notes (Addendum)
ANTICOAGULATION CONSULT NOTE - Follow Up Consult  Pharmacy Consult for heparin Indication: chest pain/ACS  No Known Allergies  Patient Measurements: Height: 5\' 11"  (180.3 cm) Weight: 227 lb (103 kg) IBW/kg (Calculated) : 75.3 Heparin Dosing Weight: 97 kg  Vital Signs: Temp: 98.2 F (36.8 C) (09/19 0404) Temp Source: Oral (09/19 0404) BP: 110/52 (09/19 0409) Pulse Rate: 66 (09/19 0409)  Labs:  Recent Labs  08/20/16 1247  08/21/16 0311  08/22/16 0033 08/22/16 0525 08/22/16 0837 08/22/16 1701 08/23/16 0218 08/23/16 0445  HGB  --   < > 10.4*  --  10.7*  --   --   --  9.1*  --   HCT  --   --  32.6*  --  32.9*  --   --   --  28.3*  --   PLT  --   --  142*  --  130*  --   --   --  143*  --   LABPROT  --   --   --   --   --   --   --   --   --  16.1*  INR  --   --   --   --   --   --   --   --   --  1.28  HEPARINUNFRC  --   < > 1.60*  < > 0.75* 0.74*  --  0.28* 0.34  --   CREATININE  --   --  1.73*  --   --   --  1.53*  --  1.67*  --   TROPONINI 0.62*  --   --   --   --   --   --   --   --   --   < > = values in this interval not displayed.  Estimated Creatinine Clearance: 38.8 mL/min (by C-G formula based on SCr of 1.67 mg/dL (H)).   Medications:  Infusions:  . sodium chloride 10 mL/hr at 08/23/16 0542  . heparin 700 Units/hr (08/22/16 2347)    Assessment: 82 yo man with CP, elevated troponins on IV heparin drip. Not on anticoagulation PTA.  Heparin level is now therapeutic 0.34 after multiple decreases for supratherapeutic levels to 700 units/h. No bleeding noted. Hg down to 9.1, plt low stable 143. Plan is for cath today.   Goal of Therapy:  Heparin level 0.3-0.7 units/ml Monitor platelets by anticoagulation protocol: Yes   Plan:  - Heparin drip at 700 units/hr - Daily heparin level and CBC - Monitor for s/sx of bleeding - For cath 9/19 afternoon  Babs BertinHaley Greysen Swanton, PharmD, [facility] Clinical Pharmacist Pager [phone] 08/23/2016 9:04 AM

## 2016-08-23 NOTE — Progress Notes (Signed)
Patient Profile: [age]yoM with HTN, diet controlled diabetes, admitted with chest pain. Ruled in for NSTEMI. Echo with reduced EF at 20-25%.  Pt also has renal insuffiencey.   Subjective: Currently CP free. Denies dyspnea.   Objective: Vital signs in last 24 hours: Temp:  [98.2 F (36.8 C)-98.4 F (36.9 C)] 98.2 F (36.8 C) (09/19 0404) Pulse Rate:  [60-68] 66 (09/19 0409) Resp:  [17-18] 17 (09/19 0404) BP: (98-116)/(40-60) 110/52 (09/19 0409) SpO2:  [90 %-98 %] 90 % (09/19 0409) Weight:  [103 kg (227 lb)] 103 kg (227 lb) (09/19 0541) Last BM Date: 08/19/16  Intake/Output from previous day: 09/18 0701 - 09/19 0700 In: 720 [P.O.:720] Out: -  Intake/Output this shift: No intake/output data recorded.  Medications Current Facility-Administered Medications  Medication Dose Route Frequency Provider Last Rate Last Dose  . 0.9 %  sodium chloride infusion  250 mL Intravenous PRN Brittainy Sherlynn CarbonM Simmons, PA-C      . 0.9 %  sodium chloride infusion   Intravenous Continuous Allayne ButcherBrittainy M Simmons, PA-C 10 mL/hr at 08/23/16 0542    . acetaminophen (TYLENOL) tablet 650 mg  650 mg Oral Q4H PRN Levie HeritageJacob J Stinson, DO   650 mg at 08/22/16 2343  . aspirin EC tablet 325 mg  325 mg Oral Daily Rhona RaiderJacob J Stinson, DO   325 mg at 08/22/16 1102  . atorvastatin (LIPITOR) tablet 40 mg  40 mg Oral q1800 Pricilla RifflePaula V Ross, MD   40 mg at 08/22/16 1802  . clopidogrel (PLAVIX) tablet 75 mg  75 mg Oral Daily Dossie ArbourEdward Sze, MD   75 mg at 08/22/16 1103  . finasteride (PROSCAR) tablet 5 mg  5 mg Oral Daily Rhona RaiderJacob J Stinson, DO   5 mg at 08/22/16 1102  . heparin ADULT infusion 100 units/mL (25000 units/21550mL sodium chloride 0.45%)  700 Units/hr Intravenous Continuous Lynita LombardJennifer D WybooDurham, RPH 7 mL/hr at 08/22/16 2347 700 Units/hr at 08/22/16 2347  . metoprolol tartrate (LOPRESSOR) tablet 25 mg  25 mg Oral BID Pricilla RifflePaula V Ross, MD   25 mg at 08/22/16 2147  . ondansetron (ZOFRAN) injection 4 mg  4 mg Intravenous Q6H PRN Rhona RaiderJacob J Stinson, DO       . pantoprazole (PROTONIX) EC tablet 40 mg  40 mg Oral Daily Jodelle GrossKathryn M Lawrence, NP   40 mg at 08/22/16 1102  . sodium chloride flush (NS) 0.9 % injection 3 mL  3 mL Intravenous Q12H Brittainy M Simmons, PA-C      . sodium chloride flush (NS) 0.9 % injection 3 mL  3 mL Intravenous PRN Brittainy M Simmons, PA-C      . terazosin (HYTRIN) capsule 5 mg  5 mg Oral Daily Rhona RaiderJacob J Stinson, DO   5 mg at 08/21/16 [id number]  . zolpidem (AMBIEN) tablet 5 mg  5 mg Oral QHS PRN Levie HeritageJacob J Stinson, DO        PE: General appearance: alert, cooperative, no distress and moderately obese Neck: no carotid bruit and no JVD Lungs: faint rales in RLL Heart: regular rate and rhythm Extremities: 2+ bilateral LEE pitting edema Pulses: 2+ and symmetric Skin: warm and dry Neurologic: Grossly normal  Lab Results:   Recent Labs  08/21/16 0311 08/22/16 0033 08/23/16 0218  WBC 6.7 7.2 6.8  HGB 10.4* 10.7* 9.1*  HCT 32.6* 32.9* 28.3*  PLT 142* 130* 143*   BMET  Recent Labs  08/21/16 0311 08/22/16 0837 08/23/16 0218  NA 140 139 139  K 3.6 3.6 3.8  CL 108 109 109  CO2 25 23 23   GLUCOSE 164* 153* 151*  BUN 23* 19 21*  CREATININE 1.73* 1.53* 1.67*  CALCIUM 8.3* 8.3* 8.0*   PT/INR  Recent Labs  08/23/16 0445  LABPROT 16.1*  INR 1.28   Cholesterol No results for input(s): CHOL in the last 72 hours. Cardiac Panel (last 3 results)  Recent Labs  08/20/16 0759 08/20/16 1247  TROPONINI 0.74* 0.62*     Studies/Results: 2D Echo 08/21/16 Study Conclusions  - Left ventricle: The cavity size was normal. Wall thickness was   increased in a pattern of moderate LVH. Systolic function was   severely reduced. The estimated ejection fraction was in the   range of 20% to 25%. There is akinesis of the   mid-apicalanteroseptal myocardium. Features are consistent with a   pseudonormal left ventricular filling pattern, with concomitant   abnormal relaxation and increased filling pressure (grade 2    diastolic dysfunction). - Aortic valve: Valve mobility was restricted. There was mild   stenosis. There was mild regurgitation. Peak velocity (S): 211   cm/s. Mean gradient (S): 9 mm Hg. - Mitral valve: There was moderate regurgitation. - Left atrium: The atrium was moderately dilated. - Pulmonary arteries: Systolic pressure was moderately increased.   PA peak pressure: 57 mm Hg (S).   Assessment/Plan  Principal Problem:   Chest pain Active Problems:   Elevated troponin   Diabetes mellitus without complication (HCC)   Essential hypertension   Esophageal reflux   Chronic kidney disease (CKD), stage III (moderate)   NSTEMI (non-ST elevated myocardial infarction) (HCC)   Renal insufficiency   1. NSTEMI: Currently CP free. Troponin peaked at 0.74. Echo shows reduced LVEF of 20-25%. Plan on definative cath +/-PCI, today Cr is stable   Lab Results  Component Value Date   CREATININE 1.67 (H) 08/23/2016   BUN 21 (H) 08/23/2016   NA 139 08/23/2016   K 3.8 08/23/2016   CL 109 08/23/2016   CO2 23 08/23/2016     2. Systolic HF: Acute systolic dysfunction in setting of SEMI  EF 20-25% on echo. Plan for LHC this admit to r/o ischemic etiology. Currently on Lopressor. Given LV dysfunction, will need to change to either Toprol XL or Coreg. No ACE/ARB currently given renal insuffiencey. If BP allows, can later add hydralazine + nitrate. Low sodium diet. Monitor volume status.   3. DM: per IM.   4. HTN: BP is currently well controlled on BB. Change to Toprol XL vs Coreg as outlined above.    LOS: 4 days   Charlton Haws

## 2016-08-24 ENCOUNTER — Inpatient Hospital Stay (HOSPITAL_COMMUNITY): Payer: Medicare Other

## 2016-08-24 ENCOUNTER — Encounter (HOSPITAL_COMMUNITY)
Admission: EM | Disposition: A | Discharge status: Skilled Nursing Facility | Payer: Self-pay | Source: Home / Self Care | Attending: Internal Medicine

## 2016-08-24 ENCOUNTER — Encounter (HOSPITAL_COMMUNITY): Payer: Self-pay | Admitting: Cardiology

## 2016-08-24 DIAGNOSIS — M7989 Other specified soft tissue disorders: Secondary | ICD-10-CM

## 2016-08-24 DIAGNOSIS — I209 Angina pectoris, unspecified: Secondary | ICD-10-CM

## 2016-08-24 DIAGNOSIS — M79609 Pain in unspecified limb: Secondary | ICD-10-CM

## 2016-08-24 HISTORY — PX: CARDIAC CATHETERIZATION: SHX172

## 2016-08-24 LAB — CBC
HCT: 25 % — ABNORMAL LOW (ref 39.0–52.0)
HEMATOCRIT: 27 % — AB (ref 39.0–52.0)
HEMOGLOBIN: 7.9 g/dL — AB (ref 13.0–17.0)
HEMOGLOBIN: 8.6 g/dL — AB (ref 13.0–17.0)
MCH: 29.4 pg (ref 26.0–34.0)
MCH: 29.2 pg (ref 26.0–34.0)
MCHC: 31.6 g/dL (ref 30.0–36.0)
MCHC: 31.9 g/dL (ref 30.0–36.0)
MCV: 92.9 fL (ref 78.0–100.0)
MCV: 91.5 fL (ref 78.0–100.0)
PLATELETS: 140 10*3/uL — AB (ref 150–400)
Platelets: 134 10*3/uL — ABNORMAL LOW (ref 150–400)
RBC: 2.69 MIL/uL — AB (ref 4.22–5.81)
RBC: 2.95 MIL/uL — ABNORMAL LOW (ref 4.22–5.81)
RDW: 14.1 % (ref 11.5–15.5)
RDW: 15 % (ref 11.5–15.5)
WBC: 7.2 10*3/uL (ref 4.0–10.5)
WBC: 8.4 10*3/uL (ref 4.0–10.5)

## 2016-08-24 LAB — ABO/RH: ABO/RH(D): B POS

## 2016-08-24 LAB — BASIC METABOLIC PANEL
ANION GAP: 9 (ref 5–15)
ANION GAP: 8 (ref 5–15)
BUN: 20 mg/dL (ref 6–20)
BUN: 22 mg/dL — AB (ref 6–20)
CHLORIDE: 110 mmol/L (ref 101–111)
CHLORIDE: 108 mmol/L (ref 101–111)
CO2: 20 mmol/L — ABNORMAL LOW (ref 22–32)
CO2: 22 mmol/L (ref 22–32)
Calcium: 8 mg/dL — ABNORMAL LOW (ref 8.9–10.3)
Calcium: 7.8 mg/dL — ABNORMAL LOW (ref 8.9–10.3)
Creatinine, Ser: 1.49 mg/dL — ABNORMAL HIGH (ref 0.61–1.24)
Creatinine, Ser: 1.64 mg/dL — ABNORMAL HIGH (ref 0.61–1.24)
GFR calc Af Amer: 47 mL/min — ABNORMAL LOW (ref >60)
GFR calc Af Amer: 42 mL/min — ABNORMAL LOW (ref >60)
GFR, EST NON AFRICAN AMERICAN: 41 mL/min — AB (ref >60)
GFR, EST NON AFRICAN AMERICAN: 36 mL/min — AB (ref >60)
GLUCOSE: 166 mg/dL — AB (ref 65–99)
Glucose, Bld: 97 mg/dL (ref 65–99)
POTASSIUM: 3.8 mmol/L (ref 3.5–5.1)
POTASSIUM: 4.1 mmol/L (ref 3.5–5.1)
SODIUM: 139 mmol/L (ref 135–145)
Sodium: 138 mmol/L (ref 135–145)

## 2016-08-24 LAB — GLUCOSE, CAPILLARY
GLUCOSE-CAPILLARY: 105 mg/dL — AB (ref 65–99)
GLUCOSE-CAPILLARY: 94 mg/dL (ref 65–99)

## 2016-08-24 LAB — POCT ACTIVATED CLOTTING TIME
Activated Clotting Time: 378 seconds
Activated Clotting Time: 224 seconds
Activated Clotting Time: 285 seconds

## 2016-08-24 LAB — MRSA PCR SCREENING: MRSA BY PCR: NEGATIVE

## 2016-08-24 LAB — HEPARIN LEVEL (UNFRACTIONATED)
Heparin Unfractionated: 0.18 IU/mL — ABNORMAL LOW (ref 0.30–0.70)
Heparin Unfractionated: 0.17 IU/mL — ABNORMAL LOW (ref 0.30–0.70)

## 2016-08-24 LAB — PREPARE RBC (CROSSMATCH)

## 2016-08-24 SURGERY — CORONARY STENT INTERVENTION W/IMPELLA
Anesthesia: LOCAL

## 2016-08-24 MED ORDER — LIDOCAINE HCL (PF) 1 % IJ SOLN
INTRAMUSCULAR | Status: DC | PRN
  Administered 2016-08-24: 15 mL
  Administered 2016-08-24: 2 mL

## 2016-08-24 MED ORDER — BIVALIRUDIN BOLUS VIA INFUSION - CUPID
INTRAVENOUS | Status: DC | PRN
  Administered 2016-08-24: 77.7 mg via INTRAVENOUS

## 2016-08-24 MED ORDER — SODIUM CHLORIDE 0.9 % IV SOLN: INTRAVENOUS | Status: DC

## 2016-08-24 MED ORDER — SODIUM CHLORIDE 0.9 % IV SOLN
Freq: Once | INTRAVENOUS | Status: AC
  Administered 2016-08-24: 15:00:00 via INTRAVENOUS

## 2016-08-24 MED ORDER — MIDAZOLAM HCL 2 MG/2ML IJ SOLN
INTRAMUSCULAR | Status: DC | PRN
  Administered 2016-08-24 (×2): 1 mg via INTRAVENOUS

## 2016-08-24 MED ORDER — SODIUM CHLORIDE 0.9% FLUSH
3.0000 mL | Freq: Two times a day (BID) | INTRAVENOUS | Status: DC
  Administered 2016-08-24 – 2016-08-30 (×5): 3 mL via INTRAVENOUS

## 2016-08-24 MED ORDER — LIDOCAINE HCL (PF) 1 % IJ SOLN
INTRAMUSCULAR | Status: AC
  Filled 2016-08-24: qty 30

## 2016-08-24 MED ORDER — SODIUM CHLORIDE 0.9% FLUSH: 3.0000 mL | INTRAVENOUS | Status: DC | PRN

## 2016-08-24 MED ORDER — SODIUM CHLORIDE 0.9 % IV SOLN: 250.0000 mL | INTRAVENOUS | Status: DC | PRN

## 2016-08-24 MED ORDER — FENTANYL CITRATE (PF) 100 MCG/2ML IJ SOLN
INTRAMUSCULAR | Status: DC | PRN
  Administered 2016-08-24 (×3): 25 ug via INTRAVENOUS

## 2016-08-24 MED ORDER — HEPARIN (PORCINE) IN NACL 2-0.9 UNIT/ML-% IJ SOLN
INTRAMUSCULAR | Status: DC | PRN
  Administered 2016-08-24: 1500 mL

## 2016-08-24 MED ORDER — SODIUM CHLORIDE 0.9% FLUSH: 3.0000 mL | Freq: Two times a day (BID) | INTRAVENOUS | Status: DC

## 2016-08-24 MED ORDER — CLOPIDOGREL BISULFATE 300 MG PO TABS
600.0000 mg | ORAL_TABLET | Freq: Once | ORAL | Status: AC
  Administered 2016-08-24: 600 mg via ORAL
  Filled 2016-08-24: qty 2

## 2016-08-24 MED ORDER — MIDAZOLAM HCL 2 MG/2ML IJ SOLN
INTRAMUSCULAR | Status: AC
  Filled 2016-08-24: qty 2

## 2016-08-24 MED ORDER — SODIUM CHLORIDE 0.9 % IV SOLN: Freq: Once | INTRAVENOUS | Status: DC

## 2016-08-24 MED ORDER — IOPAMIDOL (ISOVUE-370) INJECTION 76%
INTRAVENOUS | Status: DC | PRN
  Administered 2016-08-24: 105 mL via INTRA_ARTERIAL

## 2016-08-24 MED ORDER — ASPIRIN 81 MG PO CHEW
81.0000 mg | CHEWABLE_TABLET | Freq: Every day | ORAL | Status: DC
  Administered 2016-08-25 – 2016-08-30 (×6): 81 mg via ORAL
  Filled 2016-08-24 (×6): qty 1

## 2016-08-24 MED ORDER — HEPARIN SODIUM (PORCINE) 5000 UNIT/ML IJ SOLN
5000.0000 [IU] | Freq: Three times a day (TID) | INTRAMUSCULAR | Status: DC
  Administered 2016-08-25 – 2016-08-28 (×10): 5000 [IU] via SUBCUTANEOUS
  Filled 2016-08-24 (×9): qty 1

## 2016-08-24 MED ORDER — IOPAMIDOL (ISOVUE-370) INJECTION 76%
INTRAVENOUS | Status: AC
  Filled 2016-08-24: qty 125

## 2016-08-24 MED ORDER — HEPARIN (PORCINE) IN NACL 2-0.9 UNIT/ML-% IJ SOLN
INTRAMUSCULAR | Status: AC
Start: 2016-08-24 — End: 2016-08-24
  Filled 2016-08-24: qty 500

## 2016-08-24 MED ORDER — BIVALIRUDIN 250 MG IV SOLR
INTRAVENOUS | Status: DC | PRN
  Administered 2016-08-24: 1.75 mg/kg/h via INTRAVENOUS
  Administered 2016-08-24: 17:00:00

## 2016-08-24 MED ORDER — FUROSEMIDE 10 MG/ML IJ SOLN
INTRAMUSCULAR | Status: DC | PRN
  Administered 2016-08-24: 80 mg via INTRAVENOUS

## 2016-08-24 MED ORDER — IOPAMIDOL (ISOVUE-370) INJECTION 76%
INTRAVENOUS | Status: AC
  Filled 2016-08-24: qty 100

## 2016-08-24 MED ORDER — SODIUM CHLORIDE 0.9% FLUSH
3.0000 mL | Freq: Two times a day (BID) | INTRAVENOUS | Status: DC
  Administered 2016-08-24: 3 mL via INTRAVENOUS

## 2016-08-24 MED ORDER — FENTANYL CITRATE (PF) 100 MCG/2ML IJ SOLN
INTRAMUSCULAR | Status: AC
  Filled 2016-08-24: qty 2

## 2016-08-24 MED ORDER — BIVALIRUDIN 250 MG IV SOLR
INTRAVENOUS | Status: AC
  Filled 2016-08-24: qty 250

## 2016-08-24 MED ORDER — NITROGLYCERIN 1 MG/10 ML FOR IR/CATH LAB
INTRA_ARTERIAL | Status: AC
  Filled 2016-08-24: qty 10

## 2016-08-24 MED ORDER — FUROSEMIDE 10 MG/ML IJ SOLN
INTRAMUSCULAR | Status: AC
  Filled 2016-08-24: qty 4

## 2016-08-24 MED ORDER — HEPARIN (PORCINE) IN NACL 2-0.9 UNIT/ML-% IJ SOLN
INTRAMUSCULAR | Status: AC
  Filled 2016-08-24: qty 1000

## 2016-08-24 MED ORDER — CLOPIDOGREL BISULFATE 75 MG PO TABS
75.0000 mg | ORAL_TABLET | Freq: Every day | ORAL | Status: DC
  Administered 2016-08-25 – 2016-08-30 (×6): 75 mg via ORAL
  Filled 2016-08-24 (×6): qty 1

## 2016-08-24 MED ORDER — VERAPAMIL HCL 2.5 MG/ML IV SOLN
INTRAVENOUS | Status: AC
  Filled 2016-08-24: qty 2

## 2016-08-24 MED ORDER — ASPIRIN 81 MG PO CHEW: 81.0000 mg | CHEWABLE_TABLET | ORAL | Status: DC

## 2016-08-24 MED ORDER — VERAPAMIL HCL 2.5 MG/ML IV SOLN
INTRAVENOUS | Status: DC | PRN
  Administered 2016-08-24: 10 mL via INTRA_ARTERIAL

## 2016-08-24 SURGICAL SUPPLY — 29 items
BALLN EMERGE MR 2.5X15 (BALLOONS) ×2
BALLN ~~LOC~~ EMERGE MR 3.0X12 (BALLOONS) ×2
BALLN ~~LOC~~ TREK RX 4.0X12 (BALLOONS) ×2
BALLOON EMERGE MR 2.5X15 (BALLOONS) ×1 IMPLANT
BALLOON ~~LOC~~ EMERGE MR 3.0X12 (BALLOONS) ×1 IMPLANT
BALLOON ~~LOC~~ TREK RX 4.0X12 (BALLOONS) ×1 IMPLANT
CATH INFINITI 5 FR AL2 (CATHETERS) ×2 IMPLANT
CATH INFINITI 5FR ANG PIGTAIL (CATHETERS) ×2 IMPLANT
CATH VISTA GUIDE 6FR XBLAD3.5 (CATHETERS) ×2 IMPLANT
DEVICE CLOSURE PERCLS PRGLD 6F (VASCULAR PRODUCTS) ×2 IMPLANT
DEVICE RAD COMP TR BAND LRG (VASCULAR PRODUCTS) ×2 IMPLANT
ELECT DEFIB PAD ADLT CADENCE (PAD) ×2 IMPLANT
FEM STOP ARCH (HEMOSTASIS) ×1
GLIDESHEATH SLEND SS 6F .021 (SHEATH) ×2 IMPLANT
KIT ENCORE 26 ADVANTAGE (KITS) ×4 IMPLANT
KIT HEART LEFT (KITS) ×2 IMPLANT
PACK CARDIAC CATHETERIZATION (CUSTOM PROCEDURE TRAY) ×2 IMPLANT
PERCLOSE PROGLIDE 6F (VASCULAR PRODUCTS) ×4
SET IMPELLA CP PUMP (CATHETERS) ×2 IMPLANT
SHEATH PINNACLE 8F 10CM (SHEATH) ×4 IMPLANT
STENT PROMUS PREM MR 3.5X16 (Permanent Stent) ×2 IMPLANT
SYSTEM COMPRESSION FEMOSTOP (HEMOSTASIS) ×1 IMPLANT
TRANSDUCER W/STOPCOCK (MISCELLANEOUS) ×2 IMPLANT
TUBING CIL FLEX 10 FLL-RA (TUBING) ×2 IMPLANT
WIRE ASAHI PROWATER 180CM (WIRE) ×2 IMPLANT
WIRE EMERALD 3MM-J .025X260CM (WIRE) ×2 IMPLANT
WIRE EMERALD 3MM-J .035X150CM (WIRE) ×2 IMPLANT
WIRE HI TORQ WHISPER MS 190CM (WIRE) ×4 IMPLANT
WIRE SAFE-T 1.5MM-J .035X260CM (WIRE) ×2 IMPLANT

## 2016-08-24 NOTE — Progress Notes (Addendum)
**  Preliminary report by tech**  Bilateral lower extremity venous duplex completed.  Technically difficult study due to edema, depth of vessels, patient immobility, acoustic shadowing, and surgical dressings. No evidence of deep vein or superficial thrombosis involving the right lower extremity. There is evidence of deep vein thrombosis involving one of the left paired peroneal veins and superficial thrombosis involving the left lesser saphenous vein. There is no evidence of Baker's cysts bilaterally. Results given to the patient's nurse, Megan.  08/24/16 1:38 PM Olen CordialGreg Zinia Innocent RVT

## 2016-08-24 NOTE — H&P (View-Only) (Signed)
Asked to see patient for consideration of protected PCI. He is 82 years old and presented with NSTEMI and acute systolic heart failure with LVEF 20-25%. He underwent cath yesterday and is found to have severe RCA stenosis, diffuse LAD stenosis, and critical distal left main stenosis. An IABP was placed and cardiac surgical consultation was obtained with Dr Bartle. In the setting of advanced age and multiple comorbid conditions, his surgical risk is considered prohibitive.   The patient is evaluated at the bedside. He is stable with an IABP in place. His elderly wife is at the bedside. I have reviewed risks, indications, and alternatives to high-risk PCI with hemodynamic support with them. They understand the risk of peri-procedural infarction and even mortality are much higher than normal. However, he really doesn't have any other options and he does not wish to consider palliative medical care at this time. He otherwise was living independently albeit a sedentary lifestyle prior to this hospitalization. As he wishes to proceed with PCI, I think it is best to move forward with this today as he could potentially suffer serious complications with such critical coronary anatomy.  Plan:  Transfuse 1 unit PRBC  Plan exchange out IABP for Impella in cath lab and perform PTCA and stenting of the distal left main bifurcation  Idora Brosious 08/24/2016 2:18 PM  

## 2016-08-24 NOTE — Progress Notes (Signed)
ANTICOAGULATION CONSULT NOTE - Follow-up Consult  Pharmacy Consult for Heparin Indication: ACS with IABP  No Known Allergies  Patient Measurements: Height: 6\' 1"  (185.4 cm) Weight: 228 lb 6.3 oz (103.6 kg) IBW/kg (Calculated) : 79.9  Vital Signs: Temp: 99.5 F (37.5 C) (09/20 1400) Temp Source: Core (Comment) (09/20 1200) BP: 109/44 (09/20 1400) Pulse Rate: 70 (09/20 1400)  Labs:  Recent Labs  08/22/16 0033  08/22/16 0837  08/23/16 0218 08/23/16 0445 08/24/16 0600 08/24/16 1330  HGB 10.7*  --   --   --  9.1*  --  7.9*  --   HCT 32.9*  --   --   --  28.3*  --  25.0*  --   PLT 130*  --   --   --  143*  --  140*  --   LABPROT  --   --   --   --   --  16.1*  --   --   INR  --   --   --   --   --  1.28  --   --   HEPARINUNFRC 0.75*  < >  --   < > 0.34  --  0.18* 0.17*  CREATININE  --   --  1.53*  --  1.67*  --  1.49*  --   < > = values in this interval not displayed.  Estimated Creatinine Clearance: 45 mL/min (by C-G formula based on SCr of 1.49 mg/dL (H)).   Medical History: Past Medical History:  Diagnosis Date  . Arthritis   . Diabetes mellitus without complication (HCC)   . GERD (gastroesophageal reflux disease)    depending on diet  . Hypertension     Medications:  Scheduled:  . sodium chloride   Intravenous Once  . aspirin  81 mg Oral Pre-Cath  . aspirin EC  325 mg Oral Daily  . atorvastatin  40 mg Oral q1800  . finasteride  5 mg Oral Daily  . metoprolol tartrate  25 mg Oral BID  . pantoprazole  40 mg Oral Daily  . sodium chloride flush  3 mL Intravenous Q12H  . sodium chloride flush  3 mL Intravenous Q12H  . terazosin  5 mg Oral Daily    Assessment: 88 yoM for heparin for NSTEMI now s/p PCI with IABP (goal HL 0.2-0.5), not a candidate for CABG per CT surg, now with plans for potential repeat PCI. Heparin level slightly subtherapeutic today at 0.18. Concern for penile bleeding with condom cath and Hgb dropped from 9.1>7.9, plt wnl. Per RN, after  discussion with MD they are hesitant to increase heparin at this time despite HL being 0.18. Will continue at current drip rate and recheck level in 8 hours to ensure still close to therapeutic range.  Follow-up heparin level this afternoon remains low at 0.17.  However, giventhat bleeding continues and plan for cath lab shortly will not increase gtt rate.   Goal of Therapy:  Heparin level 0.2-0.5 units/ml Monitor platelets by anticoagulation protocol: Yes   Plan:  -F/u plans for heparin after PCI/Impella.  Tad MooreJessica Simranjit Thayer, Pharm D, BCPS  Clinical Pharmacist Pager [phone]  08/24/2016 2:34 PM

## 2016-08-24 NOTE — Progress Notes (Signed)
Subjective:  No chest pain IN CCU with IABP left leg  Objective:  Vitals:   08/24/16 0600 08/24/16 0625 08/24/16 0700 08/24/16 0800  BP: (!) 111/45 (!) 102/40 (!) 92/43 (!) 106/43  Pulse: 75 77 70 72  Resp: (!) 22 (!) 21 17 (!) 25  Temp: 99.3 F (37.4 C) 99.3 F (37.4 C) 99.3 F (37.4 C) 99.3 F (37.4 C)  TempSrc:    Core (Comment)  SpO2: 94% 95% 93% 95%  Weight:      Height:        Intake/Output from previous day:  Intake/Output Summary (Last 24 hours) at 08/24/16 1610 Last data filed at 08/24/16 0800  Gross per 24 hour  Intake            754.9 ml  Output              251 ml  Net            503.9 ml    Physical Exam: Affect appropriate Chronically ill white male  HEENT: normal Neck supple with no adenopathy JVP normal no bruits no thyromegaly Lungs clear with no wheezing and good diaphragmatic motion Heart:  S1/S2 AS  murmur, no rub, gallop or click PMI normal Abdomen: benighn, BS positve, no tenderness, no AAA no bruit.  No HSM or HJR Distal pulses intact with no bruits Plus one edema Neuro non-focal Skin warm and dry No muscular weakness IABP RFA   Lab Results: Basic Metabolic Panel:  Recent Labs  96/04/54 0218 08/24/16 0600  NA 139 139  K 3.8 3.8  CL 109 110  CO2 23 20*  GLUCOSE 151* 97  BUN 21* 20  CREATININE 1.67* 1.49*  CALCIUM 8.0* 8.0*   CBC:  Recent Labs  08/23/16 0218 08/24/16 0600  WBC 6.8 7.2  HGB 9.1* 7.9*  HCT 28.3* 25.0*  MCV 92.2 92.9  PLT 143* 140*    Anemia Panel: No results for input(s): VITAMINB12, FOLATE, FERRITIN, TIBC, IRON, RETICCTPCT in the last 72 hours.  Imaging: Dg Chest Port 1 View  Result Date: 08/24/2016 CLINICAL DATA:  Acute myocardial infarction with acute systolic heart failure. EXAM: PORTABLE CHEST 1 VIEW COMPARISON:  08/23/2016 and 08/19/2016 FINDINGS: Intra-aortic balloon pump in place. Swan-Ganz catheter tip is in the right pulmonary artery. Pulmonary vascularity is normal. Minimal  residual atelectasis at the right base. Left base has cleared. IMPRESSION: Improving aeration of the left lung base. Minimal right base atelectasis. Electronically Signed   By: Francene Boyers M.D.   On: 08/24/2016 07:41   Dg Chest Port 1 View  Result Date: 08/23/2016 CLINICAL DATA:  On intra-aortic balloon pump assist EXAM: PORTABLE CHEST 1 VIEW COMPARISON:  08/19/2016 FINDINGS: IABP marker projected over the aortic arch. Swan-Ganz catheter inserted via inferior approach with tip over the right hilum. Shallow inspiration with elevation of the right hemidiaphragm. Atelectasis in the lung bases previous right lung opacity has improved since prior study suggesting that it probably represented pneumonia or atelectasis. Small right pleural effusion. Old right rib fracture. No pneumothorax. Aortic calcification. IMPRESSION: Shallow inspiration. Atelectasis in the lung bases. Improving infiltration or atelectasis in the right lung base since previous study. Small right pleural effusion. Appliances appear in satisfactory position. Electronically Signed   By: Burman Nieves M.D.   On: 08/23/2016 21:30    Cardiac Studies:  ECG: SR LAD LBBB    Telemetry:  NSR 08/24/2016   Echo: EF 20-25% AS likely significant mean gradient 9 mmHg  Medications:   . aspirin EC  325 mg Oral Daily  . atorvastatin  40 mg Oral q1800  . finasteride  5 mg Oral Daily  . metoprolol tartrate  25 mg Oral BID  . pantoprazole  40 mg Oral Daily  . sodium chloride flush  3 mL Intravenous Q12H  . terazosin  5 mg Oral Daily     . sodium chloride 10 mL/hr at 08/23/16 2000  . heparin 700 Units/hr (08/23/16 2203)    Assessment/Plan:  CAD:  Severe LM and tight RCA. Turned down by CVTS Dr Laneta SimmersBartle due to Age , skin grafts on chest and AS.  Discussed with Dr Excell Seltzerooper and Tim Hopkins.  Also called wife and spoke with patient / nurse. Percutaneous intervention very high risk Would need Impella.  Also has mid LAD disease and multiple lesions in  RCA both proximal and mid. Will keep IABP in 1:1 for now as they can switch out for Impella sheath and would  Worry that any hypotension or vagal reaction with sheath pull could cause Hypotension and MI with tight LM.  Heparin is subRx but has some hematuria and will leave drip as is. On ASA.    Wife coming in latter today Will await review by interventionalist.   Tim Hopkins 08/24/2016, 9:52 AM

## 2016-08-24 NOTE — Progress Notes (Signed)
CSW consult acknowledged re: "Wife just recently had massive MI and is at home without care." [name] has been in communication with attending physicians and no further concerns reported. [name] plans to come visit Patient today. CSW signing off. Please contact if new need(s) arise.      Lance MussAshley Gardner,MSW, LCSW [facility] Clinical Social Worker [phone]

## 2016-08-24 NOTE — Progress Notes (Signed)
Asked to see patient for consideration of protected PCI. He is 82 years old and presented with NSTEMI and acute systolic heart failure with LVEF 20-25%. He underwent cath yesterday and is found to have severe RCA stenosis, diffuse LAD stenosis, and critical distal left main stenosis. An IABP was placed and cardiac surgical consultation was obtained with Dr Laneta SimmersBartle. In the setting of advanced age and multiple comorbid conditions, his surgical risk is considered prohibitive.   The patient is evaluated at the bedside. He is stable with an IABP in place. His elderly wife is at the bedside. I have reviewed risks, indications, and alternatives to high-risk PCI with hemodynamic support with them. They understand the risk of peri-procedural infarction and even mortality are much higher than normal. However, he really doesn't have any other options and he does not wish to consider palliative medical care at this time. He otherwise was living independently albeit a sedentary lifestyle prior to this hospitalization. As he wishes to proceed with PCI, I think it is best to move forward with this today as he could potentially suffer serious complications with such critical coronary anatomy.  Plan:  Transfuse 1 unit PRBC  Plan exchange out IABP for Impella in cath lab and perform PTCA and stenting of the distal left main bifurcation  Tonny BollmanCooper, Cerys Winget 08/24/2016 2:18 PM

## 2016-08-24 NOTE — Interval H&P Note (Signed)
Cath Lab Visit (complete for each Cath Lab visit)  Clinical Evaluation Leading to the Procedure:   ACS: Yes.    Non-ACS:    Anginal Classification: CCS IV  Anti-ischemic medical therapy: Maximal Therapy (2 or more classes of medications)  Non-Invasive Test Results: No non-invasive testing performed  Prior CABG: No previous CABG   Pre-procedure comorbid conditions include anemia, hematuria, acute heart failure. Multiple issues as outlined, formal cardiac surgical candidate turned down for CABG with prohibitive risk, for protected left main PCI today.  History and Physical Interval Note:  08/24/2016 4:08 PM  Tim GratesJohn A Sigala Jr.  has presented today for surgery, with the diagnosis of Schedule PCI/ Impella  The various methods of treatment have been discussed with the patient and family. After consideration of risks, benefits and other options for treatment, the patient has consented to  Procedure(s): Coronary Stent Intervention w/Impella (N/A) as a surgical intervention .  The patient's history has been reviewed, patient examined, no change in status, stable for surgery.  I have reviewed the patient's chart and labs.  Questions were answered to the patient's satisfaction.     Tim Hopkins, Tim Hopkins

## 2016-08-24 NOTE — Progress Notes (Signed)
ANTICOAGULATION CONSULT NOTE - Follow-up Consult  Pharmacy Consult for Heparin Indication: ACS with IABP  No Known Allergies  Patient Measurements: Height: 6\' 1"  (185.4 cm) Weight: 228 lb 6.3 oz (103.6 kg) IBW/kg (Calculated) : 79.9  Vital Signs: Temp: 99.5 F (37.5 C) (09/20 1000) Temp Source: Core (Comment) (09/20 0800) BP: 114/37 (09/20 1000) Pulse Rate: 80 (09/20 1000)  Labs:  Recent Labs  08/22/16 0033  08/22/16 0837 08/22/16 1701 08/23/16 0218 08/23/16 0445 08/24/16 0600  HGB 10.7*  --   --   --  9.1*  --  7.9*  HCT 32.9*  --   --   --  28.3*  --  25.0*  PLT 130*  --   --   --  143*  --  140*  LABPROT  --   --   --   --   --  16.1*  --   INR  --   --   --   --   --  1.28  --   HEPARINUNFRC 0.75*  < >  --  0.28* 0.34  --  0.18*  CREATININE  --   --  1.53*  --  1.67*  --  1.49*  < > = values in this interval not displayed.  Estimated Creatinine Clearance: 45 mL/min (by C-G formula based on SCr of 1.49 mg/dL (H)).   Medical History: Past Medical History:  Diagnosis Date  . Arthritis   . Diabetes mellitus without complication (HCC)   . GERD (gastroesophageal reflux disease)    depending on diet  . Hypertension     Medications:  Scheduled:  . aspirin EC  325 mg Oral Daily  . atorvastatin  40 mg Oral q1800  . finasteride  5 mg Oral Daily  . metoprolol tartrate  25 mg Oral BID  . pantoprazole  40 mg Oral Daily  . sodium chloride flush  3 mL Intravenous Q12H  . terazosin  5 mg Oral Daily    Assessment: 88 yoM for heparin for NSTEMI now s/p PCI with IABP (goal HL 0.2-0.5), not a candidate for CABG per CT surg, now with plans for potential repeat PCI. Heparin level slightly subtherapeutic today at 0.18. Concern for penile bleeding with condom cath and Hgb dropped from 9.1>7.9, plt wnl. Per RN, after discussion with MD they are hesitant to increase heparin at this time despite HL being 0.18. Will continue at current drip rate and recheck level in 8 hours to  ensure still close to therapeutic range.   Goal of Therapy:  Heparin level 0.2-0.5 units/ml Monitor platelets by anticoagulation protocol: Yes   Plan:  -Continue Heparin 700 units/hr, no bolus -Heparin level in 8hr -Daily HL, CBC -Watch for s/sx of bleeding closely  Fredonia HighlandMichael Bitonti, PharmD PGY-1 Pharmacy Resident Pager: [phone] 08/24/2016

## 2016-08-25 ENCOUNTER — Inpatient Hospital Stay (HOSPITAL_COMMUNITY): Payer: Medicare Other

## 2016-08-25 ENCOUNTER — Encounter (HOSPITAL_COMMUNITY): Payer: Self-pay | Admitting: Cardiovascular Disease

## 2016-08-25 DIAGNOSIS — R7989 Other specified abnormal findings of blood chemistry: Secondary | ICD-10-CM

## 2016-08-25 LAB — BASIC METABOLIC PANEL
ANION GAP: 10 (ref 5–15)
BUN: 24 mg/dL — ABNORMAL HIGH (ref 6–20)
CO2: 23 mmol/L (ref 22–32)
Calcium: 7.9 mg/dL — ABNORMAL LOW (ref 8.9–10.3)
Chloride: 106 mmol/L (ref 101–111)
Creatinine, Ser: 1.63 mg/dL — ABNORMAL HIGH (ref 0.61–1.24)
GFR calc Af Amer: 42 mL/min — ABNORMAL LOW (ref >60)
GFR calc non Af Amer: 37 mL/min — ABNORMAL LOW (ref >60)
GLUCOSE: 142 mg/dL — AB (ref 65–99)
POTASSIUM: 3.5 mmol/L (ref 3.5–5.1)
Sodium: 139 mmol/L (ref 135–145)

## 2016-08-25 LAB — TYPE AND SCREEN
ABO/RH(D): B POS
Antibody Screen: NEGATIVE
UNIT DIVISION: 0

## 2016-08-25 LAB — CBC
HEMATOCRIT: 27 % — AB (ref 39.0–52.0)
HEMOGLOBIN: 8.7 g/dL — AB (ref 13.0–17.0)
MCH: 29.1 pg (ref 26.0–34.0)
MCHC: 32.2 g/dL (ref 30.0–36.0)
MCV: 90.3 fL (ref 78.0–100.0)
Platelets: 133 10*3/uL — ABNORMAL LOW (ref 150–400)
RBC: 2.99 MIL/uL — ABNORMAL LOW (ref 4.22–5.81)
RDW: 15.2 % (ref 11.5–15.5)
WBC: 8.5 10*3/uL (ref 4.0–10.5)

## 2016-08-25 MED ORDER — FERROUS SULFATE 325 (65 FE) MG PO TABS
325.0000 mg | ORAL_TABLET | Freq: Two times a day (BID) | ORAL | Status: DC
  Administered 2016-08-25 – 2016-08-30 (×11): 325 mg via ORAL
  Filled 2016-08-25 (×11): qty 1

## 2016-08-25 NOTE — Progress Notes (Signed)
PROGRESS NOTE    Tim GratesJohn A Bracco Jr.  UEA:[id number] DOB: Jan 19, 1928 DOA: 08/19/2016 PCP: Ignatius Speckinghruv B Vyas, MD   Brief Narrative: Tim GratesJohn A Lichtenberger Jr. is a 82 y.o. male with a history of diet-controlled diabetes, GERD, hypertension. Patient seen emergency department for left lower quadrant discomfort for 1 week, but worse last night. The discomfort is vague and is intermittent. Pain is resolved now. Additionally, the patient has several months of burning of her chest pain, nonexertional in nature.  Some radiation and jaw and shoulder. Pain last for approximately 30 minutes and then is resolved. Occurs several times a week, but not daily. Takes bicarbonate, Tums which helps with the discomfort occasionally.  Course in emergency department: CT of the abdomen is normal. Patient had elevated troponins: 0.72 and 0.78 3 hours after first draw. Patient had nitroglycerin and aspirin which completely resolved his chest pain.  Assessment & Plan:   Principal Problem:   Chest pain Active Problems:   Elevated troponin   Diabetes mellitus without complication (HCC)   Essential hypertension   Esophageal reflux   Chronic kidney disease (CKD), stage III (moderate)   NSTEMI (non-ST elevated myocardial infarction) (HCC)   Renal insufficiency   Cardiomyopathy, ischemic   Left main coronary artery disease   Acute systolic heart failure (HCC)  1-N-STEMI with reduced lvef Presents with chest pain, mild elevation of troponin, EKG changes. Echo with reduced ef at 20--25%, with lower extremity edema, patient received iv lasix and  heparin drip initially on admission 9/19, Cardiac cath with sever multivessel disease including 95-99% distal left main stenosis with lad and rca lesions, he was transferred to cardiac ICU on balloon pump and swan-ganz monitoring , thoracic surgery consulted who advised against surgery,  9/20 he was brought back to cath lab underwent coronary stent to LM With impella  Currently on plavix/asa/  betablocker and statin. Cardiology to decide when to restart lisinopril.  2. Anemia: possible from hematuria S/p prbc transfusion on 9/20, hgn stable after transfusion, hematuria seem has improved, on iron supplement  3. Diabetes -  Continue diet control, a1c 6.2  4. chronic kidney disease stage 3;  Per record last cr at 1.8.  Cr close to baseline, renal dosing meds   5. Hypertension Stable on betablocker, acei held since admission, cardiology to decide when to start patient back on lisinopril.  6. GERD Started  Protonix  7-Abdominal pain; CT scan negative for acute pathology.  Pain improved.     DVT prophylaxis: Heparin  Code Status: Full code,  Family Communication: discussed with patient.  Disposition Plan: remain in CCU, f/u on cardiology recommendations   Consultants:   Cardiology    Procedures:  9/19 diagnostic cardiac cath with balloon pump and swan-ganz placement  9/20 pci with impella  prbc transfusion on 9/20  Antimicrobials:  none  Subjective: Feeling better, denies pain, no sob, no wheezing, on room air, no hematuria, had bmx1 today  Objective: Vitals:   08/25/16 1500 08/25/16 1600 08/25/16 1700 08/25/16 1800  BP: (!) 99/46 (!) 106/50 (!) 104/53 (!) 119/50  Pulse: 61 (!) 59 62 66  Resp: (!) 23 (!) 23 16 (!) 26  Temp:  98.1 F (36.7 C)    TempSrc:  Oral    SpO2: 99% 99% 99% 100%  Weight:      Height:        Intake/Output Summary (Last 24 hours) at 08/25/16 1831 Last data filed at 08/25/16 1646  Gross per 24 hour  Intake  406 ml  Output             1975 ml  Net            -1569 ml   Filed Weights   08/19/16 1034 08/23/16 0541 08/23/16 2000  Weight: 103.9 kg (229 lb) 103 kg (227 lb) 103.6 kg (228 lb 6.3 oz)    Examination:  General exam: Appears calm and comfortable  Respiratory system: Clear to auscultation. Respiratory effort normal. Cardiovascular system: S1 & S2 heard, RRR. No JVD, murmurs, rubs, gallops or  clicks. Mild bilateral pedal edema. Gastrointestinal system: Abdomen is nondistended, soft and nontender. No organomegaly or masses felt. Normal bowel sounds heard. Central nervous system: Alert and oriented. No focal neurological deficits. Extremities: Symmetric 5 x 5 power. Skin: No rashes, lesions or ulcers Psychiatry: Judgement and insight appear normal. Mood & affect appropriate.     Data Reviewed: I have personally reviewed following labs and imaging studies  CBC:  Recent Labs Lab 08/22/16 0033 08/23/16 0218 08/24/16 0600 08/24/16 2237 08/25/16 0214  WBC 7.2 6.8 7.2 8.4 8.5  HGB 10.7* 9.1* 7.9* 8.6* 8.7*  HCT 32.9* 28.3* 25.0* 27.0* 27.0*  MCV 91.6 92.2 92.9 91.5 90.3  PLT 130* 143* 140* 134* 133*   Basic Metabolic Panel:  Recent Labs Lab 08/22/16 0837 08/23/16 0218 08/24/16 0600 08/24/16 2237 08/25/16 0214  NA 139 139 139 138 139  K 3.6 3.8 3.8 4.1 3.5  CL 109 109 110 108 106  CO2 23 23 20* 22 23  GLUCOSE 153* 151* 97 166* 142*  BUN 19 21* 20 22* 24*  CREATININE 1.53* 1.67* 1.49* 1.64* 1.63*  CALCIUM 8.3* 8.0* 8.0* 7.8* 7.9*   GFR: Estimated Creatinine Clearance: 41.1 mL/min (by C-G formula based on SCr of 1.63 mg/dL (H)). Liver Function Tests:  Recent Labs Lab 08/19/16 1046  AST 19  ALT 11*  ALKPHOS 41  BILITOT 0.7  PROT 6.8  ALBUMIN 3.9    Recent Labs Lab 08/19/16 1046  LIPASE 29   No results for input(s): AMMONIA in the last 168 hours. Coagulation Profile:  Recent Labs Lab 08/23/16 0445  INR 1.28   Cardiac Enzymes:  Recent Labs Lab 08/19/16 1446 08/19/16 1750 08/20/16 0152 08/20/16 0759 08/20/16 1247  TROPONINI 0.78* 0.81* 0.63* 0.74* 0.62*   BNP (last 3 results) No results for input(s): PROBNP in the last 8760 hours. HbA1C: No results for input(s): HGBA1C in the last 72 hours. CBG:  Recent Labs Lab 08/23/16 2058 08/24/16 0005 08/24/16 0406  GLUCAP 108* 105* 94   Lipid Profile: No results for input(s): CHOL,  HDL, LDLCALC, TRIG, CHOLHDL, LDLDIRECT in the last 72 hours. Thyroid Function Tests: No results for input(s): TSH, T4TOTAL, FREET4, T3FREE, THYROIDAB in the last 72 hours. Anemia Panel: No results for input(s): VITAMINB12, FOLATE, FERRITIN, TIBC, IRON, RETICCTPCT in the last 72 hours. Sepsis Labs: No results for input(s): PROCALCITON, LATICACIDVEN in the last 168 hours.  Recent Results (from the past 240 hour(s))  MRSA PCR Screening     Status: None   Collection Time: 08/23/16 10:31 PM  Result Value Ref Range Status   MRSA by PCR NEGATIVE NEGATIVE Final    Comment:        The GeneXpert MRSA Assay (FDA approved for NASAL specimens only), is one component of a comprehensive MRSA colonization surveillance program. It is not intended to diagnose MRSA infection nor to guide or monitor treatment for MRSA infections.  Radiology Studies: Dg Chest Port 1 View  Result Date: 08/25/2016 CLINICAL DATA:  Congestive heart failure EXAM: PORTABLE CHEST 1 VIEW COMPARISON:  08/24/2016 FINDINGS: Cardiomediastinal silhouette is stable. Mild thoracic dextroscoliosis. Persistent right basilar atelectasis or scarring. Old right lower rib fracture. No infiltrate or pulmonary edema. Left arm PICC line with tip in left innominate vein. No pneumothorax. IMPRESSION: Mild thoracic dextroscoliosis. Persistent right basilar atelectasis or scarring. Old right lower rib fracture. No infiltrate or pulmonary edema. Electronically Signed   By: Natasha Mead M.D.   On: 08/25/2016 09:18   Dg Chest Port 1 View  Result Date: 08/24/2016 CLINICAL DATA:  Acute myocardial infarction with acute systolic heart failure. EXAM: PORTABLE CHEST 1 VIEW COMPARISON:  08/23/2016 and 08/19/2016 FINDINGS: Intra-aortic balloon pump in place. Swan-Ganz catheter tip is in the right pulmonary artery. Pulmonary vascularity is normal. Minimal residual atelectasis at the right base. Left base has cleared. IMPRESSION: Improving aeration of  the left lung base. Minimal right base atelectasis. Electronically Signed   By: Francene Boyers M.D.   On: 08/24/2016 07:41   Dg Chest Port 1 View  Result Date: 08/23/2016 CLINICAL DATA:  On intra-aortic balloon pump assist EXAM: PORTABLE CHEST 1 VIEW COMPARISON:  08/19/2016 FINDINGS: IABP marker projected over the aortic arch. Swan-Ganz catheter inserted via inferior approach with tip over the right hilum. Shallow inspiration with elevation of the right hemidiaphragm. Atelectasis in the lung bases previous right lung opacity has improved since prior study suggesting that it probably represented pneumonia or atelectasis. Small right pleural effusion. Old right rib fracture. No pneumothorax. Aortic calcification. IMPRESSION: Shallow inspiration. Atelectasis in the lung bases. Improving infiltration or atelectasis in the right lung base since previous study. Small right pleural effusion. Appliances appear in satisfactory position. Electronically Signed   By: Burman Nieves M.D.   On: 08/23/2016 21:30        Scheduled Meds: . aspirin  81 mg Oral Daily  . atorvastatin  40 mg Oral q1800  . clopidogrel  75 mg Oral Q breakfast  . ferrous sulfate  325 mg Oral BID WC  . finasteride  5 mg Oral Daily  . heparin  5,000 Units Subcutaneous Q8H  . metoprolol tartrate  25 mg Oral BID  . pantoprazole  40 mg Oral Daily  . sodium chloride flush  3 mL Intravenous Q12H  . sodium chloride flush  3 mL Intravenous Q12H  . sodium chloride flush  3 mL Intravenous Q12H  . terazosin  5 mg Oral Daily   Continuous Infusions: . sodium chloride 10 mL/hr at 08/23/16 2000     LOS: 6 days    Time spent: 35 minutes.     Albertine Grates, MD PhD [facility] Pager [phone]  If 7PM-7AM, please contact night-coverage [email or web address] Password [facility] 08/25/2016, 6:31 PM

## 2016-08-25 NOTE — Progress Notes (Signed)
Subjective:  No pain or dyspnea   Objective:  Vitals:   08/25/16 0300 08/25/16 0400 08/25/16 0500 08/25/16 0744  BP: (!) 102/48 (!) 95/52 (!) 111/44   Pulse: 60 61 (!) 59   Resp: (!) 21 19 (!) 25   Temp: 97.7 F (36.5 C)   98 F (36.7 C)  TempSrc: Oral   Oral  SpO2: 91% (!) 89% 97%   Weight:      Height:        Intake/Output from previous day:  Intake/Output Summary (Last 24 hours) at 08/25/16 0756 Last data filed at 08/25/16 0600  Gross per 24 hour  Intake           1153.1 ml  Output             1550 ml  Net           -396.9 ml    Physical Exam: Affect appropriate Chronically ill white male  HEENT: normal Neck supple with no adenopathy JVP normal no bruits no thyromegaly Lungs clear with no wheezing and good diaphragmatic motion Heart:  S1/S2 AS  murmur, no rub, gallop or click PMI normal Abdomen: benighn, BS positve, no tenderness, no AAA no bruit.  No HSM or HJR Distal pulses intact with no bruits Plus one edema Neuro non-focal Skin grafts on neck/ chest  No muscular weakness Right groin post perclose Impella and Swan no hematoma    Lab Results: Basic Metabolic Panel:  Recent Labs  40/98/1109/20/17 2237 08/25/16 0214  NA 138 139  K 4.1 3.5  CL 108 106  CO2 22 23  GLUCOSE 166* 142*  BUN 22* 24*  CREATININE 1.64* 1.63*  CALCIUM 7.8* 7.9*   CBC:  Recent Labs  08/24/16 2237 08/25/16 0214  WBC 8.4 8.5  HGB 8.6* 8.7*  HCT 27.0* 27.0*  MCV 91.5 90.3  PLT 134* 133*     Imaging: Dg Chest Port 1 View  Result Date: 08/24/2016 CLINICAL DATA:  Acute myocardial infarction with acute systolic heart failure. EXAM: PORTABLE CHEST 1 VIEW COMPARISON:  08/23/2016 and 08/19/2016 FINDINGS: Intra-aortic balloon pump in place. Swan-Ganz catheter tip is in the right pulmonary artery. Pulmonary vascularity is normal. Minimal residual atelectasis at the right base. Left base has cleared. IMPRESSION: Improving aeration of the left lung base. Minimal right base  atelectasis. Electronically Signed   By: Francene BoyersJames  Maxwell M.D.   On: 08/24/2016 07:41   Dg Chest Port 1 View  Result Date: 08/23/2016 CLINICAL DATA:  On intra-aortic balloon pump assist EXAM: PORTABLE CHEST 1 VIEW COMPARISON:  08/19/2016 FINDINGS: IABP marker projected over the aortic arch. Swan-Ganz catheter inserted via inferior approach with tip over the right hilum. Shallow inspiration with elevation of the right hemidiaphragm. Atelectasis in the lung bases previous right lung opacity has improved since prior study suggesting that it probably represented pneumonia or atelectasis. Small right pleural effusion. Old right rib fracture. No pneumothorax. Aortic calcification. IMPRESSION: Shallow inspiration. Atelectasis in the lung bases. Improving infiltration or atelectasis in the right lung base since previous study. Small right pleural effusion. Appliances appear in satisfactory position. Electronically Signed   By: Burman NievesWilliam  Stevens M.D.   On: 08/23/2016 21:30    Cardiac Studies:  ECG: SR LAD LBBB    Telemetry:  NSR 08/25/2016   Echo: EF 20-25% AS likely significant mean gradient 9 mmHg  Medications:   . aspirin  81 mg Oral Daily  . atorvastatin  40 mg Oral q1800  . clopidogrel  75 mg Oral Q breakfast  . finasteride  5 mg Oral Daily  . heparin  5,000 Units Subcutaneous Q8H  . metoprolol tartrate  25 mg Oral BID  . pantoprazole  40 mg Oral Daily  . sodium chloride flush  3 mL Intravenous Q12H  . sodium chloride flush  3 mL Intravenous Q12H  . sodium chloride flush  3 mL Intravenous Q12H  . terazosin  5 mg Oral Daily     . sodium chloride 10 mL/hr at 08/23/16 2000    Assessment/Plan:  CAD:  Post LM stent with Impella.  Still has residual prox/mid RCA dix and mid LAD disease Will discuss timing of Further intervention with Dr Excell Seltzer.  Continue ASA/Plavix.    Urine:  Hematuria has cleared continue finasteride  CHF:  Check CXR and BNP mild upper airway wheezing may need a dose of  lasix today   Chol;  On statin   Anemia:  Hct stable post unit of blood yesterday    Charlton Haws 08/25/2016, 7:56 AM

## 2016-08-26 DIAGNOSIS — E876 Hypokalemia: Secondary | ICD-10-CM

## 2016-08-26 LAB — CBC
HCT: 25 % — ABNORMAL LOW (ref 39.0–52.0)
Hemoglobin: 8.2 g/dL — ABNORMAL LOW (ref 13.0–17.0)
MCH: 29.7 pg (ref 26.0–34.0)
MCHC: 32.8 g/dL (ref 30.0–36.0)
MCV: 90.6 fL (ref 78.0–100.0)
PLATELETS: 140 10*3/uL — AB (ref 150–400)
RBC: 2.76 MIL/uL — AB (ref 4.22–5.81)
RDW: 14.8 % (ref 11.5–15.5)
WBC: 7.6 10*3/uL (ref 4.0–10.5)

## 2016-08-26 LAB — BASIC METABOLIC PANEL
ANION GAP: 4 — AB (ref 5–15)
BUN: 24 mg/dL — ABNORMAL HIGH (ref 6–20)
CALCIUM: 7.8 mg/dL — AB (ref 8.9–10.3)
CO2: 27 mmol/L (ref 22–32)
Chloride: 103 mmol/L (ref 101–111)
Creatinine, Ser: 1.73 mg/dL — ABNORMAL HIGH (ref 0.61–1.24)
GFR, EST AFRICAN AMERICAN: 39 mL/min — AB (ref >60)
GFR, EST NON AFRICAN AMERICAN: 34 mL/min — AB (ref >60)
GLUCOSE: 133 mg/dL — AB (ref 65–99)
Potassium: 3.2 mmol/L — ABNORMAL LOW (ref 3.5–5.1)
Sodium: 134 mmol/L — ABNORMAL LOW (ref 135–145)

## 2016-08-26 LAB — TSH: TSH: 5.252 u[IU]/mL — ABNORMAL HIGH (ref 0.350–4.500)

## 2016-08-26 LAB — VITAMIN B12: VITAMIN B 12: 426 pg/mL (ref 180–914)

## 2016-08-26 LAB — MAGNESIUM: Magnesium: 1.8 mg/dL (ref 1.7–2.4)

## 2016-08-26 MED ORDER — LISINOPRIL 5 MG PO TABS
5.0000 mg | ORAL_TABLET | Freq: Every day | ORAL | Status: DC
  Administered 2016-08-26 – 2016-08-30 (×5): 5 mg via ORAL
  Filled 2016-08-26 (×5): qty 1

## 2016-08-26 MED ORDER — POTASSIUM CHLORIDE CRYS ER 20 MEQ PO TBCR
40.0000 meq | EXTENDED_RELEASE_TABLET | Freq: Once | ORAL | Status: AC
  Administered 2016-08-26: 40 meq via ORAL
  Filled 2016-08-26: qty 2

## 2016-08-26 NOTE — Progress Notes (Signed)
CARDIAC REHAB PHASE I   PRE:  Rate/Rhythm: 64 SR  BP:  Supine: 92/44  Sitting:   Standing:    SaO2: 96%RA  MODE:  Ambulation: few steps ft   POST:  Rate/Rhythm: 73SR  BP:  Supine:   Sitting: 101/58  Standing:    SaO2: 97%RA 1100-1140 Had difficulty standing due to left leg pain. Asst x 2 to stand using pt's cane and gait belt. Tried to stand using walker but could not. Pt only able to take a few steps due to painful foot and leg. Pt's RN followed with recliner and we had pt sit. Pt needs PT/OTconsult to assess for home needs as he lives with 80 year old wife who recently had MI. Stressed importance of plavix with stent and reviewed NTG use. Encouraged pt to not use salt shaker. Did not refer for CRP 2 as he  Is for staged PCI. We will follow his progress with PT.   Luetta Nuttingharlene Aven Cegielski, RN BSN  08/26/2016 11:34 AM

## 2016-08-26 NOTE — Progress Notes (Signed)
Subjective:  No chest pain or dyspnea LLE pain   Objective:  Vitals:   08/26/16 0500 08/26/16 0600 08/26/16 0700 08/26/16 0800  BP: (!) 111/48 (!) 109/48 (!) 117/54 (!) 122/44  Pulse: 67 64 68   Resp: 19 (!) 25 (!) 26   Temp:    97.9 F (36.6 C)  TempSrc:    Oral  SpO2: 94% 95% 95% 94%  Weight:      Height:        Intake/Output from previous day:  Intake/Output Summary (Last 24 hours) at 08/26/16 [id number] Last data filed at 08/26/16 0800  Gross per 24 hour  Intake              400 ml  Output             1150 ml  Net             -750 ml    Physical Exam: Affect appropriate Chronically ill white male  HEENT: normal Neck supple with no adenopathy JVP normal no bruits no thyromegaly Lungs clear with no wheezing and good diaphragmatic motion Heart:  S1/S2 AS  murmur, no rub, gallop or click PMI normal Abdomen: benighn, BS positve, no tenderness, no AAA no bruit.  No HSM or HJR Distal pulses intact with no bruits Vaircosities LLE with some pain to palpation  Neuro non-focal Skin grafts on neck/ chest  No muscular weakness Right groin post perclose Impella and Swan no hematoma    Lab Results: Basic Metabolic Panel:  Recent Labs  62/13/0809/21/17 0214 08/26/16 0236  NA 139 134*  K 3.5 3.2*  CL 106 103  CO2 23 27  GLUCOSE 142* 133*  BUN 24* 24*  CREATININE 1.63* 1.73*  CALCIUM 7.9* 7.8*  MG  --  1.8   CBC:  Recent Labs  08/25/16 0214 08/26/16 0236  WBC 8.5 7.6  HGB 8.7* 8.2*  HCT 27.0* 25.0*  MCV 90.3 90.6  PLT 133* 140*     Imaging: Dg Chest Port 1 View  Result Date: 08/25/2016 CLINICAL DATA:  Congestive heart failure EXAM: PORTABLE CHEST 1 VIEW COMPARISON:  08/24/2016 FINDINGS: Cardiomediastinal silhouette is stable. Mild thoracic dextroscoliosis. Persistent right basilar atelectasis or scarring. Old right lower rib fracture. No infiltrate or pulmonary edema. Left arm PICC line with tip in left innominate vein. No pneumothorax. IMPRESSION: Mild  thoracic dextroscoliosis. Persistent right basilar atelectasis or scarring. Old right lower rib fracture. No infiltrate or pulmonary edema. Electronically Signed   By: Natasha MeadLiviu  Pop M.D.   On: 08/25/2016 09:18    Cardiac Studies:  ECG: SR LAD LBBB    Telemetry:  NSR 08/26/2016   Echo: EF 20-25% AS likely significant mean gradient 9 mmHg  Medications:   . aspirin  81 mg Oral Daily  . atorvastatin  40 mg Oral q1800  . clopidogrel  75 mg Oral Q breakfast  . ferrous sulfate  325 mg Oral BID WC  . finasteride  5 mg Oral Daily  . heparin  5,000 Units Subcutaneous Q8H  . metoprolol tartrate  25 mg Oral BID  . pantoprazole  40 mg Oral Daily  . sodium chloride flush  3 mL Intravenous Q12H  . sodium chloride flush  3 mL Intravenous Q12H  . sodium chloride flush  3 mL Intravenous Q12H  . terazosin  5 mg Oral Daily     . sodium chloride 10 mL/hr at 08/23/16 2000    Assessment/Plan:  CAD:  Post LM stent with  Impella.  Still has residual prox/mid RCA dix and mid LAD disease Staged RCA intervention with Dr Excell Seltzer in 2-3 weeks.  Continue ASA/Plavix.    Urine:  Hematuria has cleared continue finasteride  CHF:   Improved CXR with basilar atelectasis start low dose ACE   Chol;  On statin   Anemia:  Hct stable post unit of blood during procedure  On iron  Phlebitis:  Duplex with superficial phlebitis LLE no DVT on [place] heparin in hous ambulate No indication for anticoagulation for superficial phlebitis   Hopefully d/c over weekend would have him f/u with Dr Excell Seltzer in 2 weeks to arrange intervention and long term f/u will be with Dr Diona Browner in [place] Umaiza Matusik 08/26/2016, 8:32 AM   Patient ID: Tim Grates., male   DOB: 26-Oct-1928, 82 y.o.   MRN: [id number]

## 2016-08-26 NOTE — Evaluation (Signed)
Physical Therapy Evaluation Patient Details Name: Tim GratesJohn A Theiler Jr. MRN: [id number] DOB: Jan 08, 1928 Today's Date: 08/26/2016   History of Present Illness   Tim GratesJohn A Blickenstaff Jr. is a 82 y.o. male with a history of diet-controlled diabetes, GERD, hypertension. Patient seen emergency department for left lower quadrant discomfort for 1 week on admit.  Positive for NSTEMI.    Clinical Impression  Pt admitted with above diagnosis. Pt currently with functional limitations due to the deficits listed below (see PT Problem List). Pt will benefit from SNF for therapy so he can be Modif I when he returns home with wife who cannot assist pt.  Will follow acutely.  Pt will benefit from skilled PT to increase their independence and safety with mobility to allow discharge to the venue listed below.      Follow Up Recommendations SNF;Supervision/Assistance - 24 hour    Equipment Recommendations  Rolling walker with 5" wheels    Recommendations for Other Services OT consult     Precautions / Restrictions Precautions Precautions: Fall Restrictions Weight Bearing Restrictions: No      Mobility  Bed Mobility Overal bed mobility: Needs Assistance;+2 for physical assistance Bed Mobility: Sit to Supine       Sit to supine: Mod assist   General bed mobility comments: Needed assist for LEs into bed.   Transfers Overall transfer level: Needs assistance Equipment used: Straight cane Transfers: Sit to/from Stand Sit to Stand: Min assist;Mod assist;+2 safety/equipment         General transfer comment: Pt in recliner on arrival.  Wanted to stand up and use cane.  Took incr time to power up with min to mod assist.  Once up, pt had his balance with min guard assist.    Ambulation/Gait Ambulation/Gait assistance: Min assist;+2 safety/equipment Ambulation Distance (Feet): 20 Feet Assistive device: Rolling walker (2 wheeled);Straight cane Gait Pattern/deviations: Step-to pattern;Decreased step length -  left;Decreased stance time - left;Decreased weight shift to left;Antalgic;Staggering left;Staggering right;Trunk flexed;Wide base of support   Gait velocity interpretation: Below normal speed for age/gender General Gait Details: Once pt up on feet with cane, pt could not take but a few small steps with right foot due to left LE pain.  Had pt try RW and pt was able to ambulate around bed.  Still with antalgic gait but was able to unweight and take better steps with bil feet.    Stairs            Wheelchair Mobility    Modified Rankin (Stroke Patients Only)       Balance Overall balance assessment: Needs assistance Sitting-balance support: No upper extremity supported;Feet supported Sitting balance-Leahy Scale: Fair     Standing balance support: Bilateral upper extremity supported;During functional activity Standing balance-Leahy Scale: Poor Standing balance comment: relies on single or bil UE support for balance.             High level balance activites: Direction changes;Turns High Level Balance Comments: Cannot perform activities due to poor balance reactions even with RW.               Pertinent Vitals/Pain Pain Assessment: Faces Faces Pain Scale: Hurts even more Pain Location: left knee Pain Descriptors / Indicators: Aching;Sore Pain Intervention(s): Limited activity within patient's tolerance;Monitored during session;Repositioned  VSS  Home Living Family/patient expects to be discharged to:: Private residence Living Arrangements: Spouse/significant other Available Help at Discharge: Family;Neighbor;Available 24 hours/day (supervision for most part, wife [phone] and just had MI) Type of Home: House  Home Access: Ramped entrance     Home Layout: One level Home Equipment: Cane - single point;Grab bars - toilet;Grab bars - tub/shower;Shower seat;Hand held shower head      Prior Function Level of Independence: Independent with assistive device(s)          Comments: used cane PTA     Hand Dominance        Extremity/Trunk Assessment   Upper Extremity Assessment: Defer to OT evaluation           Lower Extremity Assessment: LLE deficits/detail   LLE Deficits / Details: grossly 3-/5  Cervical / Trunk Assessment: Kyphotic  Communication   Communication: No difficulties  Cognition Arousal/Alertness: Awake/alert Behavior During Therapy: WFL for tasks assessed/performed Overall Cognitive Status: Within Functional Limits for tasks assessed                      General Comments General comments (skin integrity, edema, etc.): Pt reports neighbor gets mail and does household chores but sometimes he helps too much per pt.    Exercises     Assessment/Plan    PT Assessment Patient needs continued PT services  PT Problem List Decreased activity tolerance;Decreased balance;Decreased mobility;Decreased knowledge of use of DME;Decreased safety awareness;Decreased knowledge of precautions;Decreased strength;Cardiopulmonary status limiting activity;Pain          PT Treatment Interventions DME instruction;Gait training;Functional mobility training;Therapeutic activities;Therapeutic exercise;Balance training;Patient/family education    PT Goals (Current goals can be found in the Care Plan section)  Acute Rehab PT Goals Patient Stated Goal: to go home PT Goal Formulation: With patient Time For Goal Achievement: 09/09/16 Potential to Achieve Goals: Good    Frequency Min 3X/week   Barriers to discharge Decreased caregiver support (wife 45 and just had heart attack)      Co-evaluation               End of Session Equipment Utilized During Treatment: Gait belt Activity Tolerance: Patient limited by fatigue Patient left: in bed;with call bell/phone within reach;with bed alarm set Nurse Communication: Mobility status         Time: 1610-9604 PT Time Calculation (min) (ACUTE ONLY): 24 min   Charges:   PT  Evaluation $PT Eval Moderate Complexity: 1 Procedure PT Treatments $Gait Training: 8-22 mins   PT G CodesTawni Millers Hopkins 09/25/2016, 2:52 PM [employer] Acute Rehabilitation [phone] (pager)

## 2016-08-26 NOTE — Care Management Note (Signed)
Case Management Note  Patient Details  Name: Tim GratesJohn A Tirado Jr. MRN: [id number] Date of Birth: 14-May-1928  Subjective/Objective:        Adm w ch pain            Action/Plan:lives w wife who is recovering from mi and followed by [facility].   Expected Discharge Date:  08/22/16               Expected Discharge Plan:  Home w Home Health Services  In-House Referral:     Discharge planning Services  CM Consult  Post Acute Care Choice:  Home Health Choice offered to:  Patient  DME Arranged:    DME Agency:     HH Arranged:  RN, PT HH Agency:  [facility]  Status of Service:  In process, will continue to follow  If discussed at Long Length of Stay Meetings, dates discussed:    Additional Comments: spoke w pt reg dc needs and he feels he could benefit from [facility] for rn and phy there. They lives in [place] and wife uses [facility]. Tent ref to [employer] [facility]. Will await final orders.  Hanley Haysowell, [place] T, RN 08/26/2016, 1:32 PM

## 2016-08-26 NOTE — Progress Notes (Signed)
PROGRESS NOTE    Tim GratesJohn A Vanderwoude Jr.  ZOX:[id number] DOB: 07/28/1928 DOA: 08/19/2016 PCP: Ignatius Speckinghruv B Vyas, MD   Brief Narrative: Tim GratesJohn A Gadbois Jr. is a 82 y.o. male with a history of diet-controlled diabetes, GERD, hypertension. Patient seen emergency department for left lower quadrant discomfort for 1 week, but worse last night. The discomfort is vague and is intermittent. Pain is resolved now. Additionally, the patient has several months of burning of her chest pain, nonexertional in nature.  Some radiation and jaw and shoulder. Pain last for approximately 30 minutes and then is resolved. Occurs several times a week, but not daily. Takes bicarbonate, Tums which helps with the discomfort occasionally.  Course in emergency department: CT of the abdomen is normal. Patient had elevated troponins: 0.72 and 0.78 3 hours after first draw. Patient had nitroglycerin and aspirin which completely resolved his chest pain.  Assessment & Plan:   Principal Problem:   Chest pain Active Problems:   Elevated troponin   Diabetes mellitus without complication (HCC)   Essential hypertension   Esophageal reflux   Chronic kidney disease (CKD), stage III (moderate)   NSTEMI (non-ST elevated myocardial infarction) (HCC)   Renal insufficiency   Cardiomyopathy, ischemic   Left main coronary artery disease   Acute systolic heart failure (HCC)  1-N-STEMI with reduced lvef (likely acute systolic chf) Presents with chest pain, mild elevation of troponin, EKG changes. Echo with reduced ef at 20--25%, with lower extremity edema, patient received iv lasix and  heparin drip initially on admission 9/19, Cardiac cath with sever multivessel disease including 95-99% distal left main stenosis with lad and rca lesions, he was transferred to cardiac ICU on balloon pump and swan-ganz monitoring , thoracic surgery consulted who advised against surgery,  9/20 he was brought back to cath lab underwent coronary stent to LM With impella    Currently on plavix/asa/ betablocker and statin. Cardiology restarted lisinopril on 9/22 Activity level per cardiology, patient may transfer to med tele if oks with cardiology  2. Anemia: possible from hematuria S/p prbc transfusion on 9/20, hgn stable after transfusion, hematuria seem has improved, on iron supplement  3. Diabetes -  Continue diet control, a1c 6.2  4. chronic kidney disease stage 3;  Per record last cr at 1.8.  Cr close to baseline, renal dosing meds   5. Hypertension Stable on betablocker, acei held since admission, cardiology to decide when to start patient back on lisinopril.  6. GERD Started  Protonix  7-Abdominal pain; CT scan negative for acute pathology.  Pain improved.   8. Hypokalemia: replace k    DVT prophylaxis: Heparin  Code Status: Full code,  Family Communication: discussed with patient.  Disposition Plan: to tele, eventually will need SNF   Consultants:   Cardiology    Procedures:  9/19 diagnostic cardiac cath with balloon pump and swan-ganz placement  9/20 pci with impella  prbc transfusion on 9/20  Antimicrobials:  none  Subjective: Feeling better, denies pain, no sob, no wheezing, on room air, no hematuria, had bmx1 today He is sitting in the wheelchair, getting ready to be transferred out of CCU.  Objective: Vitals:   08/26/16 0500 08/26/16 0600 08/26/16 0700 08/26/16 0800  BP: (!) 111/48 (!) 109/48 (!) 117/54 (!) 122/44  Pulse: 67 64 68   Resp: 19 (!) 25 (!) 26   Temp:    97.9 F (36.6 C)  TempSrc:    Oral  SpO2: 94% 95% 95% 94%  Weight:  Height:        Intake/Output Summary (Last 24 hours) at 08/26/16 0834 Last data filed at 08/26/16 0800  Gross per 24 hour  Intake              400 ml  Output             1150 ml  Net             -750 ml   Filed Weights   08/19/16 1034 08/23/16 0541 08/23/16 2000  Weight: 103.9 kg (229 lb) 103 kg (227 lb) 103.6 kg (228 lb 6.3 oz)    Examination:  General exam:  Appears calm and comfortable  Respiratory system: Clear to auscultation. Respiratory effort normal. Cardiovascular system: S1 & S2 heard, RRR. No JVD, murmurs, rubs, gallops or clicks. Mild bilateral pedal edema. Gastrointestinal system: Abdomen is nondistended, soft and nontender. No organomegaly or masses felt. Normal bowel sounds heard. Central nervous system: Alert and oriented. No focal neurological deficits. Extremities: Symmetric 5 x 5 power. Skin: No rashes, lesions or ulcers Psychiatry: Judgement and insight appear normal. Mood & affect appropriate.     Data Reviewed: I have personally reviewed following labs and imaging studies  CBC:  Recent Labs Lab 08/23/16 0218 08/24/16 0600 08/24/16 2237 08/25/16 0214 08/26/16 0236  WBC 6.8 7.2 8.4 8.5 7.6  HGB 9.1* 7.9* 8.6* 8.7* 8.2*  HCT 28.3* 25.0* 27.0* 27.0* 25.0*  MCV 92.2 92.9 91.5 90.3 90.6  PLT 143* 140* 134* 133* 140*   Basic Metabolic Panel:  Recent Labs Lab 08/23/16 0218 08/24/16 0600 08/24/16 2237 08/25/16 0214 08/26/16 0236  NA 139 139 138 139 134*  K 3.8 3.8 4.1 3.5 3.2*  CL 109 110 108 106 103  CO2 23 20* 22 23 27   GLUCOSE 151* 97 166* 142* 133*  BUN 21* 20 22* 24* 24*  CREATININE 1.67* 1.49* 1.64* 1.63* 1.73*  CALCIUM 8.0* 8.0* 7.8* 7.9* 7.8*  MG  --   --   --   --  1.8   GFR: Estimated Creatinine Clearance: 38.8 mL/min (by C-G formula based on SCr of 1.73 mg/dL (H)). Liver Function Tests:  Recent Labs Lab 08/19/16 1046  AST 19  ALT 11*  ALKPHOS 41  BILITOT 0.7  PROT 6.8  ALBUMIN 3.9    Recent Labs Lab 08/19/16 1046  LIPASE 29   No results for input(s): AMMONIA in the last 168 hours. Coagulation Profile:  Recent Labs Lab 08/23/16 0445  INR 1.28   Cardiac Enzymes:  Recent Labs Lab 08/19/16 1446 08/19/16 1750 08/20/16 0152 08/20/16 0759 08/20/16 1247  TROPONINI 0.78* 0.81* 0.63* 0.74* 0.62*   BNP (last 3 results) No results for input(s): PROBNP in the last 8760  hours. HbA1C: No results for input(s): HGBA1C in the last 72 hours. CBG:  Recent Labs Lab 08/23/16 2058 08/24/16 0005 08/24/16 0406  GLUCAP 108* 105* 94   Lipid Profile: No results for input(s): CHOL, HDL, LDLCALC, TRIG, CHOLHDL, LDLDIRECT in the last 72 hours. Thyroid Function Tests:  Recent Labs  08/26/16 0236  TSH 5.252*   Anemia Panel:  Recent Labs  08/26/16 0236  VITAMINB12 426   Sepsis Labs: No results for input(s): PROCALCITON, LATICACIDVEN in the last 168 hours.  Recent Results (from the past 240 hour(s))  MRSA PCR Screening     Status: None   Collection Time: 08/23/16 10:31 PM  Result Value Ref Range Status   MRSA by PCR NEGATIVE NEGATIVE Final    Comment:  The GeneXpert MRSA Assay (FDA approved for NASAL specimens only), is one component of a comprehensive MRSA colonization surveillance program. It is not intended to diagnose MRSA infection nor to guide or monitor treatment for MRSA infections.          Radiology Studies: Dg Chest Port 1 View  Result Date: 08/25/2016 CLINICAL DATA:  Congestive heart failure EXAM: PORTABLE CHEST 1 VIEW COMPARISON:  08/24/2016 FINDINGS: Cardiomediastinal silhouette is stable. Mild thoracic dextroscoliosis. Persistent right basilar atelectasis or scarring. Old right lower rib fracture. No infiltrate or pulmonary edema. Left arm PICC line with tip in left innominate vein. No pneumothorax. IMPRESSION: Mild thoracic dextroscoliosis. Persistent right basilar atelectasis or scarring. Old right lower rib fracture. No infiltrate or pulmonary edema. Electronically Signed   By: Natasha Mead M.D.   On: 08/25/2016 09:18        Scheduled Meds: . aspirin  81 mg Oral Daily  . atorvastatin  40 mg Oral q1800  . clopidogrel  75 mg Oral Q breakfast  . ferrous sulfate  325 mg Oral BID WC  . finasteride  5 mg Oral Daily  . heparin  5,000 Units Subcutaneous Q8H  . metoprolol tartrate  25 mg Oral BID  . pantoprazole  40 mg  Oral Daily  . potassium chloride  40 mEq Oral Once  . sodium chloride flush  3 mL Intravenous Q12H  . sodium chloride flush  3 mL Intravenous Q12H  . sodium chloride flush  3 mL Intravenous Q12H  . terazosin  5 mg Oral Daily   Continuous Infusions: . sodium chloride 10 mL/hr at 08/23/16 2000     LOS: 7 days    Time spent: 25 minutes.     Albertine Grates, MD PhD [facility] Pager [phone]  If 7PM-7AM, please contact night-coverage [email or web address] Password [facility] 08/26/2016, 8:34 AM

## 2016-08-27 ENCOUNTER — Encounter (HOSPITAL_COMMUNITY): Payer: Self-pay | Admitting: Radiology

## 2016-08-27 ENCOUNTER — Inpatient Hospital Stay (HOSPITAL_COMMUNITY): Payer: Medicare Other

## 2016-08-27 DIAGNOSIS — M79609 Pain in unspecified limb: Secondary | ICD-10-CM

## 2016-08-27 DIAGNOSIS — D649 Anemia, unspecified: Secondary | ICD-10-CM

## 2016-08-27 DIAGNOSIS — M7989 Other specified soft tissue disorders: Secondary | ICD-10-CM

## 2016-08-27 DIAGNOSIS — I82402 Acute embolism and thrombosis of unspecified deep veins of left lower extremity: Secondary | ICD-10-CM

## 2016-08-27 LAB — MAGNESIUM: Magnesium: 1.8 mg/dL (ref 1.7–2.4)

## 2016-08-27 LAB — BASIC METABOLIC PANEL
Anion gap: 13 (ref 5–15)
BUN: 27 mg/dL — AB (ref 6–20)
CALCIUM: 8.3 mg/dL — AB (ref 8.9–10.3)
CHLORIDE: 105 mmol/L (ref 101–111)
CO2: 20 mmol/L — AB (ref 22–32)
CREATININE: 1.59 mg/dL — AB (ref 0.61–1.24)
GFR calc non Af Amer: 38 mL/min — ABNORMAL LOW (ref >60)
GFR, EST AFRICAN AMERICAN: 44 mL/min — AB (ref >60)
GLUCOSE: 128 mg/dL — AB (ref 65–99)
Potassium: 4.2 mmol/L (ref 3.5–5.1)
Sodium: 138 mmol/L (ref 135–145)

## 2016-08-27 NOTE — Progress Notes (Signed)
PT is recommending SNF. Met with pt at bedside. He is alert and oriented x 3. Discussed PT recommendations. He agreed with SNF. He doesn't have a preference for a facility, but wants a facility near his home so his wife can visit. Contacted Jessica, SW and left a VM with referral and pt's preference.

## 2016-08-27 NOTE — Progress Notes (Signed)
VASCULAR LAB PRELIMINARY  PRELIMINARY  PRELIMINARY  PRELIMINARY  Bilateral lower extremity venous duplex completed.    Preliminary report:  There is no DVT or SVT noted in the bilateral lower extremities.  There are enlarged inguinal lymph nodes noted on the left.  Carmelina Balducci, RVT 08/27/2016, 11:05 AM

## 2016-08-27 NOTE — Progress Notes (Signed)
Patient Name: Tim GratesJohn A Caporale Jr.      SUBJECTIVE: 82 year old man admitted 9/15 with complaints of chest pain and no known coronary disease. Troponins were elevated. Ejection fraction-echo 20-25% and a concern was raised for low gradient aortic stenosis.  9/19 LHC >>Distal LM lesion, 95-99 %stenosed.  Prox LAD to Mid LAD lesion, 65 %stenosed. Dist LAD lesion, 80 %stenosed.  Ost RCA to Prox RCA lesion, 90 %stenosed. Mid RCA lesion, 80 %stenosed.  Successful IABP placement RFA  Surgery consultation was obtained but was felt to be too high surgical risk; ultimately however he underwent impella supported distal left main stenting with plans for staged intervention of the RCA and mid LAD disease in a few weeks as an outpatient.  Overnight no chest pain Problem has been progressive LLE pain  Venous dopplers 9/20 were neg.  Pain worseing Swelling to above the knee  Denies fall, but says swelling started prior to admission    Past Medical History:  Diagnosis Date  . Arthritis   . Diabetes mellitus without complication (HCC)   . GERD (gastroesophageal reflux disease)    depending on diet  . Hypertension     Scheduled Meds:  Scheduled Meds: . aspirin  81 mg Oral Daily  . atorvastatin  40 mg Oral q1800  . clopidogrel  75 mg Oral Q breakfast  . ferrous sulfate  325 mg Oral BID WC  . finasteride  5 mg Oral Daily  . heparin  5,000 Units Subcutaneous Q8H  . lisinopril  5 mg Oral Daily  . metoprolol tartrate  25 mg Oral BID  . pantoprazole  40 mg Oral Daily  . sodium chloride flush  3 mL Intravenous Q12H  . sodium chloride flush  3 mL Intravenous Q12H  . sodium chloride flush  3 mL Intravenous Q12H  . terazosin  5 mg Oral Daily   Continuous Infusions: . sodium chloride 10 mL/hr at 08/23/16 2000   sodium chloride, sodium chloride, sodium chloride, acetaminophen, ondansetron (ZOFRAN) IV, sodium chloride flush, sodium chloride flush, sodium chloride flush,  zolpidem    PHYSICAL EXAM Vitals:   08/26/16 1700 08/26/16 1845 08/26/16 2105 08/27/16 0544  BP: (!) 117/51 116/61 (!) 114/53 (!) 125/55  Pulse:  79 79 89  Resp:   20 18  Temp:  98.2 F (36.8 C) 98.4 F (36.9 C) 99.4 F (37.4 C)  TempSrc:  Oral Oral Oral  SpO2:  94% 98% 94%  Weight:      Height:        Well developed and nourished in no acute distress HENT normal Neck supple with JVP-flat Clear Regular rate and rhythm, no murmurs or gallops Abd-soft with active BS No Clubbing cyanosis  LLE swoolen to above the knee and exquisitely sensitive to touch  Skin-warm and dry  NO erythema A & Oriented  Grossly normal sensory and motor function    TELEMETRY: Reviewed telemetry pt in * **:    Intake/Output Summary (Last 24 hours) at 08/27/16 0823 Last data filed at 08/27/16 0300  Gross per 24 hour  Intake             2200 ml  Output              425 ml  Net             1775 ml    LABS: Basic Metabolic Panel:  Recent Labs Lab 08/22/16 0837 08/23/16 0218 08/24/16 0600 08/24/16 2237 08/25/16 0214  08/26/16 0236 08/27/16 0253  NA 139 139 139 138 139 134* 138  K 3.6 3.8 3.8 4.1 3.5 3.2* 4.2  CL 109 109 110 108 106 103 105  CO2 23 23 20* 22 23 27  20*  GLUCOSE 153* 151* 97 166* 142* 133* 128*  BUN 19 21* 20 22* 24* 24* 27*  CREATININE 1.53* 1.67* 1.49* 1.64* 1.63* 1.73* 1.59*  CALCIUM 8.3* 8.0* 8.0* 7.8* 7.9* 7.8* 8.3*  MG  --   --   --   --   --  1.8 1.8   Cardiac Enzymes: No results for input(s): CKTOTAL, CKMB, CKMBINDEX, TROPONINI in the last 72 hours. CBC:  Recent Labs Lab 08/21/16 0311 08/22/16 0033 08/23/16 0218 08/24/16 0600 08/24/16 2237 08/25/16 0214 08/26/16 0236  WBC 6.7 7.2 6.8 7.2 8.4 8.5 7.6  HGB 10.4* 10.7* 9.1* 7.9* 8.6* 8.7* 8.2*  HCT 32.6* 32.9* 28.3* 25.0* 27.0* 27.0* 25.0*  MCV 92.6 91.6 92.2 92.9 91.5 90.3 90.6  PLT 142* 130* 143* 140* 134* 133* 140*   PROTIME: No results for input(s): LABPROT, INR in the last 72 hours. Liver  Function Tests: No results for input(s): AST, ALT, ALKPHOS, BILITOT, PROT, ALBUMIN in the last 72 hours. No results for input(s): LIPASE, AMYLASE in the last 72 hours. BNP: BNP (last 3 results)  Recent Labs  08/19/16 1046  BNP 737.0*    ProBNP (last 3 results) No results for input(s): PROBNP in the last 8760 hours.  D-Dimer: No results for input(s): DDIMER in the last 72 hours. Hemoglobin A1C: No results for input(s): HGBA1C in the last 72 hours. Fasting Lipid Panel: No results for input(s): CHOL, HDL, LDLCALC, TRIG, CHOLHDL, LDLDIRECT in the last 72 hours. Thyroid Function Tests:  Recent Labs  08/26/16 0236  TSH 5.252*   Anemia Panel:  Recent Labs  08/26/16 0236  VITAMINB12 426        ASSESSMENT AND PLAN:  Principal Problem:   NSTEMI (non-ST elevated myocardial infarction) (HCC) Active Problems:   Left main coronary artery disease   Renal insufficiency   Diabetes mellitus without complication (HCC)   Essential hypertension   Cardiomyopathy, ischemic   Acute systolic heart failure (HCC)   Hypokalemia   Anemia  Will follow anemia Hypokalemia resolved CAD stable continue current meds WIll repeat venous dopplers as there can be progression of calf clots  >>:> UTD  isolated distal DVT cannot be detected by routine proximal vein compression ultrasonography (proximal CUS) and although it can be detected by whole leg ultrasonography, the latter is difficult to perform and interpret and its use is institution-dependent; thus, to overcome this disadvantage serial proximal CUS can be used to detect thrombus that extends into the proximal veins.  If neg will also get lower extremity films   Signed, Sherryl Manges MD  08/27/2016

## 2016-08-27 NOTE — Progress Notes (Signed)
PROGRESS NOTE    Tim GratesJohn A Slevin Jr.  [id number] DOB: December 17, 1927 DOA: 08/19/2016 PCP: Ignatius Speckinghruv B Vyas, MD   Brief Narrative: Tim GratesJohn A Degregory Jr. is a 82 y.o. male with a history of diet-controlled diabetes, GERD, hypertension. Patient seen emergency department for left lower quadrant discomfort for 1 week, but worse last night. The discomfort is vague and is intermittent. Pain is resolved now. Additionally, the patient has several months of burning of her chest pain, nonexertional in nature.  Some radiation and jaw and shoulder. Pain last for approximately 30 minutes and then is resolved. Occurs several times a week, but not daily. Takes bicarbonate, Tums which helps with the discomfort occasionally.  Course in emergency department: CT of the abdomen is normal. Patient had elevated troponins: 0.72 and 0.78 3 hours after first draw. Patient had nitroglycerin and aspirin which completely resolved his chest pain.  Assessment & Plan:   Principal Problem:   NSTEMI (non-ST elevated myocardial infarction) (HCC) Active Problems:   Diabetes mellitus without complication (HCC)   Essential hypertension   Renal insufficiency   Cardiomyopathy, ischemic   Left main coronary artery disease   Acute systolic heart failure (HCC)   Hypokalemia   Anemia  1-N-STEMI with reduced lvef (likely acute systolic chf) Presents with chest pain, mild elevation of troponin, EKG changes. Echo with reduced ef at 20--25%, with lower extremity edema, patient received iv lasix and  heparin drip initially on admission 9/19, Cardiac cath with sever multivessel disease including 95-99% distal left main stenosis with lad and rca lesions, he was transferred to cardiac ICU on balloon pump and swan-ganz monitoring , thoracic surgery consulted who advised against surgery,  9/20 he was brought back to cath lab underwent coronary stent to LM With impella  Currently on plavix/asa/ betablocker and statin. Cardiology restarted lisinopril on  9/22 Activity level per cardiology, patient may transfer to med tele if oks with cardiology  2. Anemia: possible from hematuria S/p prbc transfusion on 9/20, hgn stable after transfusion, hematuria seem has improved, on iron supplement  3. Diabetes -  Continue diet control, a1c 6.2  4. chronic kidney disease stage 3;  Per record last cr at 1.8.  Cr close to baseline, renal dosing meds   5. Hypertension Stable on betablocker, acei held since admission, cardiology to decide when to start patient back on lisinopril.  6. GERD Started  Protonix  7-Abdominal pain; CT scan negative for acute pathology.  Pain improved.   8. Hypokalemia: replace k  9. Left leg pain: posterior aspect, patient report he has been having left leg pain for the last few weeks, but today the pain is worse, he is not comfortable, venous doppler no dvt, will get x ray    DVT prophylaxis: Heparin  Code Status: Full code,  Family Communication: discussed with patient.  Disposition Plan: SNF with cardiology [place]   Consultants:   Cardiology    Procedures:  9/19 diagnostic cardiac cath with balloon pump and swan-ganz placement  9/20 pci with impella  prbc transfusion on 9/20  Antimicrobials:  none  Subjective: C/o left posterior leg pain, denies chest pain, no sob  Objective: Vitals:   08/26/16 1700 08/26/16 1845 08/26/16 2105 08/27/16 0544  BP: (!) 117/51 116/61 (!) 114/53 (!) 125/55  Pulse:  79 79 89  Resp:   20 18  Temp:  98.2 F (36.8 C) 98.4 F (36.9 C) 99.4 F (37.4 C)  TempSrc:  Oral Oral Oral  SpO2:  94% 98% 94%  Weight:      Height:        Intake/Output Summary (Last 24 hours) at 08/27/16 0843 Last data filed at 08/27/16 0300  Gross per 24 hour  Intake             2200 ml  Output              425 ml  Net             1775 ml   Filed Weights   08/19/16 1034 08/23/16 0541 08/23/16 2000  Weight: 103.9 kg (229 lb) 103 kg (227 lb) 103.6 kg (228 lb 6.3 oz)     Examination:  General exam: not comfortable Respiratory system: Clear to auscultation. Respiratory effort normal. Cardiovascular system: S1 & S2 heard, RRR. No JVD, murmurs, rubs, gallops or clicks. Mild bilateral pedal edema. Gastrointestinal system: Abdomen is nondistended, soft and nontender. No organomegaly or masses felt. Normal bowel sounds heard. Central nervous system: Alert and oriented. No focal neurological deficits. Extremities: left lower extremity tenderness to palpation to posterior aspect, no significant edema, does has scattered ecchymosis. Some mild bilateral lower extremity pitting edema Skin: No rashes, lesions or ulcers Psychiatry: Judgement and insight appear normal. Mood & affect appropriate.     Data Reviewed: I have personally reviewed following labs and imaging studies  CBC:  Recent Labs Lab 08/23/16 0218 08/24/16 0600 08/24/16 2237 08/25/16 0214 08/26/16 0236  WBC 6.8 7.2 8.4 8.5 7.6  HGB 9.1* 7.9* 8.6* 8.7* 8.2*  HCT 28.3* 25.0* 27.0* 27.0* 25.0*  MCV 92.2 92.9 91.5 90.3 90.6  PLT 143* 140* 134* 133* 140*   Basic Metabolic Panel:  Recent Labs Lab 08/24/16 0600 08/24/16 2237 08/25/16 0214 08/26/16 0236 08/27/16 0253  NA 139 138 139 134* 138  K 3.8 4.1 3.5 3.2* 4.2  CL 110 108 106 103 105  CO2 20* 22 23 27  20*  GLUCOSE 97 166* 142* 133* 128*  BUN 20 22* 24* 24* 27*  CREATININE 1.49* 1.64* 1.63* 1.73* 1.59*  CALCIUM 8.0* 7.8* 7.9* 7.8* 8.3*  MG  --   --   --  1.8 1.8   GFR: Estimated Creatinine Clearance: 42.2 mL/min (by C-G formula based on SCr of 1.59 mg/dL (H)). Liver Function Tests: No results for input(s): AST, ALT, ALKPHOS, BILITOT, PROT, ALBUMIN in the last 168 hours. No results for input(s): LIPASE, AMYLASE in the last 168 hours. No results for input(s): AMMONIA in the last 168 hours. Coagulation Profile:  Recent Labs Lab 08/23/16 0445  INR 1.28   Cardiac Enzymes:  Recent Labs Lab 08/20/16 1247  TROPONINI 0.62*    BNP (last 3 results) No results for input(s): PROBNP in the last 8760 hours. HbA1C: No results for input(s): HGBA1C in the last 72 hours. CBG:  Recent Labs Lab 08/23/16 2058 08/24/16 0005 08/24/16 0406  GLUCAP 108* 105* 94   Lipid Profile: No results for input(s): CHOL, HDL, LDLCALC, TRIG, CHOLHDL, LDLDIRECT in the last 72 hours. Thyroid Function Tests:  Recent Labs  08/26/16 0236  TSH 5.252*   Anemia Panel:  Recent Labs  08/26/16 0236  VITAMINB12 426   Sepsis Labs: No results for input(s): PROCALCITON, LATICACIDVEN in the last 168 hours.  Recent Results (from the past 240 hour(s))  MRSA PCR Screening     Status: None   Collection Time: 08/23/16 10:31 PM  Result Value Ref Range Status   MRSA by PCR NEGATIVE NEGATIVE Final    Comment:  The GeneXpert MRSA Assay (FDA approved for NASAL specimens only), is one component of a comprehensive MRSA colonization surveillance program. It is not intended to diagnose MRSA infection nor to guide or monitor treatment for MRSA infections.          Radiology Studies: No results found.      Scheduled Meds: . aspirin  81 mg Oral Daily  . atorvastatin  40 mg Oral q1800  . clopidogrel  75 mg Oral Q breakfast  . ferrous sulfate  325 mg Oral BID WC  . finasteride  5 mg Oral Daily  . heparin  5,000 Units Subcutaneous Q8H  . lisinopril  5 mg Oral Daily  . metoprolol tartrate  25 mg Oral BID  . pantoprazole  40 mg Oral Daily  . sodium chloride flush  3 mL Intravenous Q12H  . sodium chloride flush  3 mL Intravenous Q12H  . sodium chloride flush  3 mL Intravenous Q12H  . terazosin  5 mg Oral Daily   Continuous Infusions: . sodium chloride 10 mL/hr at 08/23/16 2000     LOS: 8 days    Time spent: 25 minutes.     Tim Grates, MD PhD [facility] Pager [phone]  If 7PM-7AM, please contact night-coverage [email or web address] Password [facility] 08/27/2016, 8:43 AM

## 2016-08-28 ENCOUNTER — Inpatient Hospital Stay (HOSPITAL_COMMUNITY): Payer: Medicare Other

## 2016-08-28 DIAGNOSIS — M79605 Pain in left leg: Secondary | ICD-10-CM

## 2016-08-28 DIAGNOSIS — M79662 Pain in left lower leg: Secondary | ICD-10-CM

## 2016-08-28 LAB — CBC
HEMATOCRIT: 25.6 % — AB (ref 39.0–52.0)
Hemoglobin: 8.3 g/dL — ABNORMAL LOW (ref 13.0–17.0)
MCH: 29.2 pg (ref 26.0–34.0)
MCHC: 32.4 g/dL (ref 30.0–36.0)
MCV: 90.1 fL (ref 78.0–100.0)
Platelets: 183 10*3/uL (ref 150–400)
RBC: 2.84 MIL/uL — ABNORMAL LOW (ref 4.22–5.81)
RDW: 14.4 % (ref 11.5–15.5)
WBC: 8.1 10*3/uL (ref 4.0–10.5)

## 2016-08-28 LAB — BASIC METABOLIC PANEL
Anion gap: 8 (ref 5–15)
BUN: 28 mg/dL — AB (ref 6–20)
CHLORIDE: 105 mmol/L (ref 101–111)
CO2: 25 mmol/L (ref 22–32)
Calcium: 8.4 mg/dL — ABNORMAL LOW (ref 8.9–10.3)
Creatinine, Ser: 1.51 mg/dL — ABNORMAL HIGH (ref 0.61–1.24)
GFR calc Af Amer: 46 mL/min — ABNORMAL LOW (ref >60)
GFR calc non Af Amer: 40 mL/min — ABNORMAL LOW (ref >60)
Glucose, Bld: 146 mg/dL — ABNORMAL HIGH (ref 65–99)
POTASSIUM: 3.9 mmol/L (ref 3.5–5.1)
SODIUM: 138 mmol/L (ref 135–145)

## 2016-08-28 LAB — HEPARIN LEVEL (UNFRACTIONATED): Heparin Unfractionated: 0.12 IU/mL — ABNORMAL LOW (ref 0.30–0.70)

## 2016-08-28 MED ORDER — HEPARIN (PORCINE) IN NACL 100-0.45 UNIT/ML-% IJ SOLN
1150.0000 [IU]/h | INTRAMUSCULAR | Status: AC
  Administered 2016-08-28: 700 [IU]/h via INTRAVENOUS
  Administered 2016-08-29: 1000 [IU]/h via INTRAVENOUS
  Filled 2016-08-28 (×2): qty 250

## 2016-08-28 MED ORDER — GABAPENTIN 100 MG PO CAPS
100.0000 mg | ORAL_CAPSULE | Freq: Two times a day (BID) | ORAL | Status: DC
  Administered 2016-08-28 – 2016-08-30 (×5): 100 mg via ORAL
  Filled 2016-08-28 (×5): qty 1

## 2016-08-28 MED ORDER — HEPARIN BOLUS VIA INFUSION
2000.0000 [IU] | Freq: Once | INTRAVENOUS | Status: AC
  Administered 2016-08-28: 2000 [IU] via INTRAVENOUS
  Filled 2016-08-28: qty 2000

## 2016-08-28 MED ORDER — OXYCODONE-ACETAMINOPHEN 5-325 MG PO TABS
1.0000 | ORAL_TABLET | Freq: Three times a day (TID) | ORAL | Status: DC | PRN
Start: 2016-08-28 — End: 2016-08-30
  Administered 2016-08-28 – 2016-08-29 (×2): 1 via ORAL
  Filled 2016-08-28 (×2): qty 1

## 2016-08-28 NOTE — Progress Notes (Signed)
Pt is c/o left leg pain, MD notified no new order received., 650 mg Tylenol administered.

## 2016-08-28 NOTE — [place] FL2 (Signed)
[employer] MEDICAID FL2 LEVEL OF CARE SCREENING TOOL     IDENTIFICATION  Patient Name: Tim GratesJohn A Brees Jr. Birthdate: 14-Mar-1928 Sex: male Admission Date (Current Location): 08/19/2016  [facility] [place]   Facility and Address:  [facility] [place], [address], [place], [place] [address]      Provider Number: [id number]  Attending Physician Name and Address:  Albertine GratesFang Vermell [place], MD  Relative Name and Phone Number:       Current Level of Care: Hospital Recommended Level of Care: Skilled Nursing Facility Prior Approval Number:    Date Approved/Denied:   PASRR Number:   [id number]   Discharge Plan: SNF    Current Diagnoses: Patient Active Problem List   Diagnosis Date Noted  . Leg pain, left   . Anemia 08/27/2016  . Hypokalemia   . Cardiomyopathy, ischemic   . Left main coronary artery disease   . Acute systolic heart failure (HCC)   . Renal insufficiency   . NSTEMI (non-ST elevated myocardial infarction) (HCC) 08/19/2016  . Diabetes mellitus without complication (HCC)   . Essential hypertension   . Arthritis   . Bilateral pleural effusion     Orientation RESPIRATION BLADDER Height & Weight     Self, Time, Situation, Place  Normal Continent Weight: 228 lb 6.3 oz (103.6 kg) Height:  6\' 1"  (185.4 cm)  BEHAVIORAL SYMPTOMS/MOOD NEUROLOGICAL BOWEL NUTRITION STATUS   (NONE)  (NONE) Continent Diet (Heart healthy/carb modified)  AMBULATORY STATUS COMMUNICATION OF NEEDS Skin   Extensive Assist Verbally Surgical wounds                       Personal Care Assistance Level of Assistance  Bathing, Feeding, Dressing Bathing Assistance: Limited assistance Feeding assistance: Independent Dressing Assistance: Limited assistance     Functional Limitations Info  Sight, Hearing, Speech Sight Info: Adequate Hearing Info: Adequate Speech Info: Adequate    SPECIAL CARE FACTORS FREQUENCY  PT (By licensed PT)     PT Frequency:  (5x/week)              Contractures Contractures Info: Not present    Additional Factors Info  Code Status, Allergies Code Status Info:  (full) Allergies Info:  (NKDA)           Current Medications (08/28/2016):  This is the current hospital active medication list Current Facility-Administered Medications  Medication Dose Route Frequency Provider Last Rate Last Dose  . 0.9 %  sodium chloride infusion  250 mL Intravenous PRN Marykay Lexavid W Harding, MD 10 mL/hr at 08/23/16 2000 250 mL at 08/23/16 2000  . 0.9 %  sodium chloride infusion   Intravenous Continuous Marykay Lexavid W Harding, MD 10 mL/hr at 08/23/16 2000    . 0.9 %  sodium chloride infusion  250 mL Intravenous PRN Tonny BollmanMichael Cooper, MD      . 0.9 %  sodium chloride infusion  250 mL Intravenous PRN Tonny BollmanMichael Cooper, MD      . acetaminophen (TYLENOL) tablet 650 mg  650 mg Oral Q4H PRN Rhona RaiderJacob J Stinson, DO   650 mg at 08/28/16 0533  . aspirin chewable tablet 81 mg  81 mg Oral Daily Tonny BollmanMichael Cooper, MD   81 mg at 08/28/16 1208  . atorvastatin (LIPITOR) tablet 40 mg  40 mg Oral q1800 Pricilla RifflePaula V Ross, MD   40 mg at 08/27/16 1707  . clopidogrel (PLAVIX) tablet 75 mg  75 mg Oral Q breakfast Tonny BollmanMichael Cooper, MD  75 mg at 08/28/16 0818  . ferrous sulfate tablet 325 mg  325 mg Oral BID WC Wendall Stade, MD   325 mg at 08/28/16 0818  . finasteride (PROSCAR) tablet 5 mg  5 mg Oral Daily Rhona Raider Stinson, DO   5 mg at 08/28/16 1208  . gabapentin (NEURONTIN) capsule 100 mg  100 mg Oral BID Albertine Grates, MD   100 mg at 08/28/16 1016  . heparin ADULT infusion 100 units/mL (25000 units/243mL sodium chloride 0.45%)  700 Units/hr Intravenous Continuous Almon Hercules, RPH 7 mL/hr at 08/28/16 1223 700 Units/hr at 08/28/16 1223  . lisinopril (PRINIVIL,ZESTRIL) tablet 5 mg  5 mg Oral Daily Wendall Stade, MD   5 mg at 08/28/16 1208  . metoprolol tartrate (LOPRESSOR) tablet 25 mg  25 mg Oral BID Pricilla Riffle, MD   25 mg at 08/28/16 1207  . ondansetron (ZOFRAN) injection 4 mg  4 mg  Intravenous Q6H PRN Rhona Raider Stinson, DO      . oxyCODONE-acetaminophen (PERCOCET/ROXICET) 5-325 MG per tablet 1 tablet  1 tablet Oral Q8H PRN Albertine Grates, MD   1 tablet at 08/28/16 1016  . pantoprazole (PROTONIX) EC tablet 40 mg  40 mg Oral Daily Jodelle Gross, NP   40 mg at 08/28/16 1208  . sodium chloride flush (NS) 0.9 % injection 3 mL  3 mL Intravenous Q12H Marykay Lex, MD   3 mL at 08/28/16 1220  . sodium chloride flush (NS) 0.9 % injection 3 mL  3 mL Intravenous PRN Marykay Lex, MD      . sodium chloride flush (NS) 0.9 % injection 3 mL  3 mL Intravenous Q12H [name], MD   3 mL at 08/24/16 2230  . sodium chloride flush (NS) 0.9 % injection 3 mL  3 mL Intravenous PRN [name], MD      . sodium chloride flush (NS) 0.9 % injection 3 mL  3 mL Intravenous Q12H [name], MD   3 mL at 08/27/16 2201  . sodium chloride flush (NS) 0.9 % injection 3 mL  3 mL Intravenous PRN [name], MD      . terazosin (HYTRIN) capsule 5 mg  5 mg Oral Daily Rhona Raider Stinson, DO   5 mg at 08/28/16 1207  . zolpidem (AMBIEN) tablet 5 mg  5 mg Oral QHS PRN Levie Heritage, DO         Discharge Medications: Please see discharge summary for a list of discharge medications.  Relevant Imaging Results:  Relevant Lab Results:   Additional Information SS#: [id number]  Terald Sleeper, LCSW

## 2016-08-28 NOTE — Clinical Social Work Placement (Signed)
   CLINICAL SOCIAL WORK PLACEMENT  NOTE  Date:  08/28/2016  Patient Details  Name: Tim GratesJohn A Waggoner Jr. MRN: [id number] Date of Birth: 07/26/1928  Clinical Social Work is seeking post-discharge placement for this patient at the Skilled  Nursing Facility level of care (*CSW will initial, date and re-position this form in  chart as items are completed):  Yes   Patient/family provided with [facility] Department's list of facilities offering this level of care within the geographic area requested by the patient (or if unable, by the patient's family).  Yes   Patient/family informed of their freedom to choose among providers that offer the needed level of care, that participate in Medicare, Medicaid or managed care program needed by the patient, have an available bed and are willing to accept the patient.  Yes   Patient/family informed of [facility]'s ownership interest in [facility] and [facility], as well as of the fact that they are under no obligation to receive care at these facilities.  PASRR submitted to EDS on 08/28/16     PASRR number received on 08/28/16     Existing PASRR number confirmed on       FL2 transmitted to all facilities in geographic area requested by pt/family on 08/28/16     FL2 transmitted to all facilities within larger geographic area on       Patient informed that his/her managed care company has contracts with or will negotiate with certain facilities, including the following:            Patient/family informed of bed offers received.  Patient chooses bed at       Physician recommends and patient chooses bed at      Patient to be transferred to   on  .  Patient to be transferred to facility by       Patient family notified on   of transfer.  Name of family member notified:        PHYSICIAN Please sign FL2     Additional Comment:    Macario GoldsJesse Rufina Kimery, LCSW [phone]

## 2016-08-28 NOTE — Progress Notes (Addendum)
Patient Name: Tim Hopkins.      SUBJECTIVE: 82 year old man admitted 9/15 with complaints of chest pain and no known coronary disease. Troponins were elevated. Ejection fraction-echo 20-25% and a concern was raised for low gradient aortic stenosis.  9/19 LHC >>Distal LM lesion, 95-99 %stenosed.  Prox LAD to Mid LAD lesion, 65 %stenosed. Dist LAD lesion, 80 %stenosed.  Ost RCA to Prox RCA lesion, 90 %stenosed. Mid RCA lesion, 80 %stenosed.  Successful IABP placement RFA  Surgery consultation was obtained but was felt to be too high surgical risk; ultimately however he underwent impella supported distal left main stenting with plans for staged intervention of the RCA and mid LAD disease in a few weeks as an outpatient.  Overnight no chest pain  Problem has been progressive LLE pain that started  Venous dopplers 9/20 were neg and repeat neg yday.  Plain films unrevealing.        Past Medical History:  Diagnosis Date  . Arthritis   . Diabetes mellitus without complication (HCC)   . GERD (gastroesophageal reflux disease)    depending on diet  . Hypertension     Scheduled Meds:  Scheduled Meds: . aspirin  81 mg Oral Daily  . atorvastatin  40 mg Oral q1800  . clopidogrel  75 mg Oral Q breakfast  . ferrous sulfate  325 mg Oral BID WC  . finasteride  5 mg Oral Daily  . gabapentin  100 mg Oral BID  . heparin  5,000 Units Subcutaneous Q8H  . lisinopril  5 mg Oral Daily  . metoprolol tartrate  25 mg Oral BID  . pantoprazole  40 mg Oral Daily  . sodium chloride flush  3 mL Intravenous Q12H  . sodium chloride flush  3 mL Intravenous Q12H  . sodium chloride flush  3 mL Intravenous Q12H  . terazosin  5 mg Oral Daily   Continuous Infusions: . sodium chloride 10 mL/hr at 08/23/16 2000   sodium chloride, sodium chloride, sodium chloride, acetaminophen, ondansetron (ZOFRAN) IV, oxyCODONE-acetaminophen, sodium chloride flush, sodium chloride flush, sodium chloride  flush, zolpidem    PHYSICAL EXAM Vitals:   08/27/16 1336 08/27/16 1919 08/27/16 2157 08/28/16 0504  BP: (!) 92/47 (!) 116/45 (!) 124/52 126/64  Pulse: 64 72 80 80  Resp: 16 16    Temp: 98.4 F (36.9 C) 98 F (36.7 C)  98.2 F (36.8 C)  TempSrc: Oral Oral  Oral  SpO2: 97% 99%  97%  Weight:      Height:        Well developed and nourished in no acute distress HENT normal Neck supple with JVP-flat Clear Regular rate and rhythm, no murmurs or gallops Abd-soft with active BS No Clubbing cyanosis  LLE swollen to above the knee and exquisitely sensitive to touch  Skin-warm and dry  NO erythema A & Oriented  Grossly normal sensory and motor function    TELEMETRY: Reviewed telemetry pt in sinus    Intake/Output Summary (Last 24 hours) at 08/28/16 1005 Last data filed at 08/28/16 0601  Gross per 24 hour  Intake              243 ml  Output              900 ml  Net             -657 ml    LABS: Basic Metabolic Panel:  Recent Labs Lab 08/23/16 0218 08/24/16 0600  08/24/16 2237 08/25/16 0214 08/26/16 0236 08/27/16 0253 08/28/16 0303  NA 139 139 138 139 134* 138 138  K 3.8 3.8 4.1 3.5 3.2* 4.2 3.9  CL 109 110 108 106 103 105 105  CO2 23 20* 22 23 27  20* 25  GLUCOSE 151* 97 166* 142* 133* 128* 146*  BUN 21* 20 22* 24* 24* 27* 28*  CREATININE 1.67* 1.49* 1.64* 1.63* 1.73* 1.59* 1.51*  CALCIUM 8.0* 8.0* 7.8* 7.9* 7.8* 8.3* 8.4*  MG  --   --   --   --  1.8 1.8  --    Cardiac Enzymes: No results for input(s): CKTOTAL, CKMB, CKMBINDEX, TROPONINI in the last 72 hours. CBC:  Recent Labs Lab 08/22/16 0033 08/23/16 0218 08/24/16 0600 08/24/16 2237 08/25/16 0214 08/26/16 0236 08/28/16 0303  WBC 7.2 6.8 7.2 8.4 8.5 7.6 8.1  HGB 10.7* 9.1* 7.9* 8.6* 8.7* 8.2* 8.3*  HCT 32.9* 28.3* 25.0* 27.0* 27.0* 25.0* 25.6*  MCV 91.6 92.2 92.9 91.5 90.3 90.6 90.1  PLT 130* 143* 140* 134* 133* 140* 183   PROTIME: No results for input(s): LABPROT, INR in the last 72  hours. Liver Function Tests: No results for input(s): AST, ALT, ALKPHOS, BILITOT, PROT, ALBUMIN in the last 72 hours. No results for input(s): LIPASE, AMYLASE in the last 72 hours. BNP: BNP (last 3 results)  Recent Labs  08/19/16 1046  BNP 737.0*    ProBNP (last 3 results) No results for input(s): PROBNP in the last 8760 hours.  D-Dimer: No results for input(s): DDIMER in the last 72 hours. Hemoglobin A1C: No results for input(s): HGBA1C in the last 72 hours. Fasting Lipid Panel: No results for input(s): CHOL, HDL, LDLCALC, TRIG, CHOLHDL, LDLDIRECT in the last 72 hours. Thyroid Function Tests:  Recent Labs  08/26/16 0236  TSH 5.252*   Anemia Panel:  Recent Labs  08/26/16 0236  VITAMINB12 426        ASSESSMENT AND PLAN:  Principal Problem:   NSTEMI (non-ST elevated myocardial infarction) (HCC) Active Problems:   Left main coronary artery disease   Renal insufficiency   Diabetes mellitus without complication (HCC)   Essential hypertension   Cardiomyopathy, ischemic   Acute systolic heart failure (HCC)   Hypokalemia   Anemia  Anemia--stable  Hypokalemia resolved CAD stable continue current meds No ischemic symptoms   Spoke with DDickson Vasc to see Pt; does not think arterial disease? Hematoma will proceed with CT     Signed, Sherryl MangesSteven Miasia Crabtree MD  08/28/2016

## 2016-08-28 NOTE — Progress Notes (Signed)
ANTICOAGULATION CONSULT NOTE  Pharmacy Consult for Heparin Indication: new DVT in LLE  No Known Allergies  Patient Measurements: Height: 6\' 1"  (185.4 cm) Weight: 228 lb 6.3 oz (103.6 kg) IBW/kg (Calculated) : 79.9  Vital Signs: Temp: 98.2 F (36.8 C) (09/24 0504) Temp Source: Oral (09/24 0504) BP: 126/64 (09/24 0504) Pulse Rate: 80 (09/24 0504)  Labs:  Recent Labs  08/26/16 0236 08/27/16 0253 08/28/16 0303  HGB 8.2*  --  8.3*  HCT 25.0*  --  25.6*  PLT 140*  --  183  CREATININE 1.73* 1.59* 1.51*    Estimated Creatinine Clearance: 44.4 mL/min (by C-G formula based on SCr of 1.51 mg/dL (H)).   Medical History: Past Medical History:  Diagnosis Date  . Arthritis   . Diabetes mellitus without complication (HCC)   . GERD (gastroesophageal reflux disease)    depending on diet  . Hypertension     Medications:  Scheduled:  . aspirin  81 mg Oral Daily  . atorvastatin  40 mg Oral q1800  . clopidogrel  75 mg Oral Q breakfast  . ferrous sulfate  325 mg Oral BID WC  . finasteride  5 mg Oral Daily  . gabapentin  100 mg Oral BID  . heparin  5,000 Units Subcutaneous Q8H  . lisinopril  5 mg Oral Daily  . metoprolol tartrate  25 mg Oral BID  . pantoprazole  40 mg Oral Daily  . sodium chloride flush  3 mL Intravenous Q12H  . sodium chloride flush  3 mL Intravenous Q12H  . sodium chloride flush  3 mL Intravenous Q12H  . terazosin  5 mg Oral Daily    Assessment: 88 yoM previous on heparin for NSTEMI, now s/p PCI with IABP (now removed), not a candidate for CABG per CT surg, now s/p repeat PCI/temporary Impella. Heparin was transitioned to prophylaxis post-op on 9/22, but now patient found to have LLE DVT, and Pharmacy consulted to resume heparin IV.   Noted previously with concern for significant penile bleeding with condom cath and Hgb drop on heparin, but per MD notes, hematuria has cleared. Hg low stable 8.3, plt up to wnl.  Last dose of SQ heparin given at 0504 this  AM.  Goal of Therapy:  Heparin level 0.2-0.5 units/ml Monitor platelets by anticoagulation protocol: Yes   Plan:  D/c SQ heparin Start heparin at previously therapeutic rate 700 units/h (no bolus with recent bleed issues) 6h heparin level, daily heparin level/CBC Monitor closely for s/sx bleeding F/u long-term anticoagulation plan  Babs BertinHaley Shenika Quint, PharmD, [facility] WorthBCPS Clinical Pharmacist Pager [phone] 08/28/2016 11:15 AM

## 2016-08-28 NOTE — Progress Notes (Addendum)
PROGRESS NOTE    Tim Hopkins.  WUJ:[id number] DOB: 1928-04-07 DOA: 08/19/2016 PCP: Ignatius Specking, MD   Brief Narrative: Tim Hopkins. is a 82 y.o. male with a history of diet-controlled diabetes, GERD, hypertension. Patient seen emergency department for left lower quadrant discomfort for 1 week, but worse last night. The discomfort is vague and is intermittent. Pain is resolved now. Additionally, the patient has several months of burning of her chest pain, nonexertional in nature.  Some radiation and jaw and shoulder. Pain last for approximately 30 minutes and then is resolved. Occurs several times a week, but not daily. Takes bicarbonate, Tums which helps with the discomfort occasionally.  Course in emergency department: CT of the abdomen is normal. Patient had elevated troponins: 0.72 and 0.78 3 hours after first draw. Patient had nitroglycerin and aspirin which completely resolved his chest pain.  Assessment & Plan:   Principal Problem:   NSTEMI (non-ST elevated myocardial infarction) (HCC) Active Problems:   Diabetes mellitus without complication (HCC)   Essential hypertension   Renal insufficiency   Cardiomyopathy, ischemic   Left main coronary artery disease   Acute systolic heart failure (HCC)   Hypokalemia   Anemia  1-N-STEMI with reduced lvef (likely acute systolic chf) Presents with chest pain, mild elevation of troponin, EKG changes. Echo with reduced ef at 20--25%, with lower extremity edema, patient received iv lasix and  heparin drip initially on admission 9/19, Cardiac cath with sever multivessel disease including 95-99% distal left main stenosis with lad and rca lesions, he was transferred to cardiac ICU on balloon pump and swan-ganz monitoring , thoracic surgery consulted who advised against surgery,  9/20 he was brought back to cath lab underwent coronary stent to LM With impella  Currently on plavix/asa/ betablocker and statin. Cardiology restarted lisinopril on  9/22 Activity level per cardiology, patient may transfer to med tele if oks with cardiology  2. Anemia: possible from hematuria S/p prbc transfusion on 9/20, hgn stable after transfusion, hematuria seem has improved, on iron supplement  3. Diabetes -  Continue diet control, a1c 6.2  4. chronic kidney disease stage 3;  Per record last cr at 1.8.  Cr close to baseline, renal dosing meds   5. Hypertension Stable on betablocker, acei held since admission, cardiology to decide when to start patient back on lisinopril.  6. GERD Started  Protonix  7-Abdominal pain; CT scan negative for acute pathology.  Pain improved.   8. Hypokalemia: replace k  9. Left leg pain:  Left lower extremity DVT: posterior aspect, patient reports he has been having left leg pain for the last few weeks, but  pain got worse in the last two days, he is not comfortable, venous doppler no dvt, leg x ray no acute findings Persistent left leg pain, seems more swollen, warm and tender to touch around posterior aspect of left leg from ankle to calf region. I have discussed with cardiology Dr Graciela Husbands and curbside vascular surgery Dr Edilia Bo, Will get arterial study by ABI.  Will get a plain CT left lower leg for now, will need to continue serial venous US left lower leg if pain persists. Start prn percocet and schedule low dose neurontin  addendum 10:30 am Ultrasound tech report that patient barely able to tolerate venous doppler due to pain, less likely will tolerate abi testing with blood pressure cuff apply pressure on left leg, will change to serial venous doppler for now. May have to do CTA left leg  if cr improving.  venous doppler did show  deep vein thrombosis involving one of the left paired peroneal veins and superficial thrombosis involving the left lesser saphenous vein.  Will start heparin drip for now, watch for sign of bleeding, since patient is already on asa and plavix.  DVT prophylaxis: Heparin  Code  Status: Full code,  Family Communication: discussed with patient.  Disposition Plan: SNF with cardiology [place]   Consultants:   Cardiology    Procedures:  9/19 diagnostic cardiac cath with balloon pump and swan-ganz placement  9/20 pci with impella  prbc transfusion on 9/20  Antimicrobials:  none  Subjective: persistent left posterior leg pain, denies chest pain, no sob  Objective: Vitals:   08/27/16 1336 08/27/16 1919 08/27/16 2157 08/28/16 0504  BP: (!) 92/47 (!) 116/45 (!) 124/52 126/64  Pulse: 64 72 80 80  Resp: 16 16    Temp: 98.4 F (36.9 C) 98 F (36.7 C)  98.2 F (36.8 C)  TempSrc: Oral Oral  Oral  SpO2: 97% 99%  97%  Weight:      Height:        Intake/Output Summary (Last 24 hours) at 08/28/16 0950 Last data filed at 08/28/16 0601  Gross per 24 hour  Intake              243 ml  Output              900 ml  Net             -657 ml   Filed Weights   08/19/16 1034 08/23/16 0541 08/23/16 2000  Weight: 103.9 kg (229 lb) 103 kg (227 lb) 103.6 kg (228 lb 6.3 oz)    Examination:  General exam: not comfortable Respiratory system: Clear to auscultation. Respiratory effort normal. Cardiovascular system: S1 & S2 heard, RRR. No JVD, murmurs, rubs, gallops or clicks. Mild bilateral pedal edema. Gastrointestinal system: Abdomen is nondistended, soft and nontender. No organomegaly or masses felt. Normal bowel sounds heard. Central nervous system: Alert and oriented. No focal neurological deficits. Extremities: left lower extremity tenderness to palpation to posterior aspect,  +pedal Edema on left , does has scattered ecchymosis.  Skin: No rashes, lesions or ulcers Psychiatry: Judgement and insight appear normal. Mood & affect appropriate.     Data Reviewed: I have personally reviewed following labs and imaging studies  CBC:  Recent Labs Lab 08/24/16 0600 08/24/16 2237 08/25/16 0214 08/26/16 0236 08/28/16 0303  WBC 7.2 8.4 8.5 7.6 8.1  HGB 7.9*  8.6* 8.7* 8.2* 8.3*  HCT 25.0* 27.0* 27.0* 25.0* 25.6*  MCV 92.9 91.5 90.3 90.6 90.1  PLT 140* 134* 133* 140* 183   Basic Metabolic Panel:  Recent Labs Lab 08/24/16 2237 08/25/16 0214 08/26/16 0236 08/27/16 0253 08/28/16 0303  NA 138 139 134* 138 138  K 4.1 3.5 3.2* 4.2 3.9  CL 108 106 103 105 105  CO2 22 23 27  20* 25  GLUCOSE 166* 142* 133* 128* 146*  BUN 22* 24* 24* 27* 28*  CREATININE 1.64* 1.63* 1.73* 1.59* 1.51*  CALCIUM 7.8* 7.9* 7.8* 8.3* 8.4*  MG  --   --  1.8 1.8  --    GFR: Estimated Creatinine Clearance: 44.4 mL/min (by C-G formula based on SCr of 1.51 mg/dL (H)). Liver Function Tests: No results for input(s): AST, ALT, ALKPHOS, BILITOT, PROT, ALBUMIN in the last 168 hours. No results for input(s): LIPASE, AMYLASE in the last 168 hours. No results for input(s): AMMONIA in the  last 168 hours. Coagulation Profile:  Recent Labs Lab 08/23/16 0445  INR 1.28   Cardiac Enzymes: No results for input(s): CKTOTAL, CKMB, CKMBINDEX, TROPONINI in the last 168 hours. BNP (last 3 results) No results for input(s): PROBNP in the last 8760 hours. HbA1C: No results for input(s): HGBA1C in the last 72 hours. CBG:  Recent Labs Lab 08/23/16 2058 08/24/16 0005 08/24/16 0406  GLUCAP 108* 105* 94   Lipid Profile: No results for input(s): CHOL, HDL, LDLCALC, TRIG, CHOLHDL, LDLDIRECT in the last 72 hours. Thyroid Function Tests:  Recent Labs  08/26/16 0236  TSH 5.252*   Anemia Panel:  Recent Labs  08/26/16 0236  VITAMINB12 426   Sepsis Labs: No results for input(s): PROCALCITON, LATICACIDVEN in the last 168 hours.  Recent Results (from the past 240 hour(s))  MRSA PCR Screening     Status: None   Collection Time: 08/23/16 10:31 PM  Result Value Ref Range Status   MRSA by PCR NEGATIVE NEGATIVE Final    Comment:        The GeneXpert MRSA Assay (FDA approved for NASAL specimens only), is one component of a comprehensive MRSA colonization surveillance  program. It is not intended to diagnose MRSA infection nor to guide or monitor treatment for MRSA infections.          Radiology Studies: Dg Tibia/fibula Left  Result Date: 08/27/2016 EXAM: LEFT TIBIA AND FIBULA - 2 VIEW COMPARISON:  None. FINDINGS: No fracture or bone lesion. Knee joint is normally aligned. There is moderate to marked lateral compartment narrowing with marginal osteophytes. Ankle joint is normally aligned. Bones are diffusely demineralized. There is diffuse nonspecific subcutaneous soft tissue edema. IMPRESSION: 1. No fracture or acute bony abnormality. 2. Degenerative changes of the knee.  Ankle joint normally aligned. Electronically Signed   By: Amie Portland M.D.   On: 08/27/2016 15:21        Scheduled Meds: . aspirin  81 mg Oral Daily  . atorvastatin  40 mg Oral q1800  . clopidogrel  75 mg Oral Q breakfast  . ferrous sulfate  325 mg Oral BID WC  . finasteride  5 mg Oral Daily  . gabapentin  100 mg Oral BID  . heparin  5,000 Units Subcutaneous Q8H  . lisinopril  5 mg Oral Daily  . metoprolol tartrate  25 mg Oral BID  . pantoprazole  40 mg Oral Daily  . sodium chloride flush  3 mL Intravenous Q12H  . sodium chloride flush  3 mL Intravenous Q12H  . sodium chloride flush  3 mL Intravenous Q12H  . terazosin  5 mg Oral Daily   Continuous Infusions: . sodium chloride 10 mL/hr at 08/23/16 2000     LOS: 9 days    Time spent: 25 minutes.     Albertine Grates, MD PhD [facility] Pager [phone]  If 7PM-7AM, please contact night-coverage [email or web address] Password [email or web address] 08/28/2016, 9:50 AM
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
## 2016-08-28 NOTE — Progress Notes (Signed)
ANTICOAGULATION CONSULT NOTE  Pharmacy Consult for Heparin Indication: new DVT in LLE  No Known Allergies  Patient Measurements: Height: 6\' 1"  (185.4 cm) Weight: 228 lb 6.3 oz (103.6 kg) IBW/kg (Calculated) : 79.9  Vital Signs: Temp: 98.4 F (36.9 C) (09/24 1341) Temp Source: Axillary (09/24 1341) BP: 126/50 (09/24 1341) Pulse Rate: 83 (09/24 1341)  Labs:  Recent Labs  08/26/16 0236 08/27/16 0253 08/28/16 0303 08/28/16 1827  HGB 8.2*  --  8.3*  --   HCT 25.0*  --  25.6*  --   PLT 140*  --  183  --   HEPARINUNFRC  --   --   --  0.12*  CREATININE 1.73* 1.59* 1.51*  --     Estimated Creatinine Clearance: 44.4 mL/min (by C-G formula based on SCr of 1.51 mg/dL (H)).   Assessment: [age] yoM previous on heparin for NSTEMI, now s/p PCI with IABP (now removed), not a candidate for CABG per CT surg, now s/p repeat PCI/temporary Impella. Heparin was transitioned to prophylaxis post-op on 9/22, but now patient found to have LLE DVT, and Pharmacy consulted to resume heparin IV.   Noted previously with concern for significant penile bleeding with condom cath and Hgb drop on heparin, but per MD notes, hematuria has cleared. Hg low stable 8.3, plt up to wnl.  Last dose of SQ heparin given at 0504 this AM. First heparin level drawn 6 hours after heparin drip started at 700 units/hr with no bolus is low at 0.12. No bleeding reported.  RN reports IV site infusing well and no penile bleeding.   Goal of Therapy:  Heparin level 0.2-0.5 units/ml Monitor platelets by anticoagulation protocol: Yes   Plan:  Bolus 2000 units IV x 1 dose Increase heparin drip to 900 units/hr daily heparin level/CBC Monitor closely for s/sx bleeding F/u long-term anticoagulation plan  Herby AbrahamMichelle T. Loyda Costin, Pharm.D. [phone] 08/28/2016 7:02 PM

## 2016-08-28 NOTE — Clinical Social Work Note (Signed)
Clinical Social Work Assessment  Patient Details  Name: Tim A Brendlinger Jr. MRN: [id number] Date of Birth: 10/18/1928  Date of referral:  08/28/16               Reason for consult:  Facility Placement                Permission sought to share information with:  Family Supports Permission granted to share information::  Yes, Verbal Permission Granted  Name::     Tim Hopkins  Relationship::  Spouse  Contact Information:  [phone]  Housing/Transportation Living arrangements for the past 2 months:  Single Family Home Source of Information:  Patient Patient Interpreter Needed:  None Criminal Activity/Legal Involvement Pertinent to Current Situation/Hospitalization:  No - Comment as needed Significant Relationships:  Spouse Lives with:  Spouse Do you feel safe going back to the place where you live?  Yes Need for family participation in patient care:  Yes (Comment)  Care giving concerns:  No family and/or friends at bedside, however patient states that his wife is agreeable with placement short term and no further concerns addressed.   Social Worker assessment / plan:  Clinical Social Worker met with patient at bedside to offer support and discuss patient needs at discharge.  Patient states that he was living at home with his wife prior to admission.  Patient states that he has never had to do rehab in the past but is agreeable to short term placement this time around.  CSW explained SNF search process and initiated search in [place] and [place] county.  CSW remains available for support and to facilitate patient discharge needs once medically stable.  Employment status:  Retired Insurance information:  Managed Medicare PT Recommendations:  Skilled Nursing Facility Information / Referral to community resources:  Skilled Nursing Facility  Patient/Family's Response to care:  Patient states that he has had very little sleep and he felt that once he finally fell asleep there was chronic  interruptions.  Patient does verbalize understanding of CSW role and agreeable to ST-SNF placement at discharge.  Patient/Family's Understanding of and Emotional Response to Diagnosis, Current Treatment, and Prognosis:  Patient did not verbalize anything regarding his current medical care and/or needs.  Patient is realistic about his physical inability to return home at this time.  Emotional Assessment Appearance:  Appears stated age Attitude/Demeanor/Rapport:  Lethargic (Appropriate but with limited engagement) Affect (typically observed):  Accepting, Appropriate, Frustrated Orientation:  Oriented to Self, Oriented to Place, Oriented to  Time, Oriented to Situation Alcohol / Substance use:  Not Applicable Psych involvement (Current and /or in the community):  No (Comment)  Discharge Needs  Concerns to be addressed:  Discharge Planning Concerns Readmission within the last 30 days:  No Current discharge risk:  None Barriers to Discharge:  Continued Medical Work up  Jesse Scinto, LCSW [phone]  

## 2016-08-29 ENCOUNTER — Other Ambulatory Visit: Payer: Self-pay | Admitting: *Deleted

## 2016-08-29 DIAGNOSIS — I82402 Acute embolism and thrombosis of unspecified deep veins of left lower extremity: Secondary | ICD-10-CM

## 2016-08-29 LAB — BASIC METABOLIC PANEL
Anion gap: 7 (ref 5–15)
BUN: 26 mg/dL — AB (ref 6–20)
CHLORIDE: 105 mmol/L (ref 101–111)
CO2: 22 mmol/L (ref 22–32)
CREATININE: 1.42 mg/dL — AB (ref 0.61–1.24)
Calcium: 8.1 mg/dL — ABNORMAL LOW (ref 8.9–10.3)
GFR calc Af Amer: 50 mL/min — ABNORMAL LOW (ref >60)
GFR calc non Af Amer: 43 mL/min — ABNORMAL LOW (ref >60)
GLUCOSE: 159 mg/dL — AB (ref 65–99)
Potassium: 4.1 mmol/L (ref 3.5–5.1)
Sodium: 134 mmol/L — ABNORMAL LOW (ref 135–145)

## 2016-08-29 LAB — HEPARIN LEVEL (UNFRACTIONATED)
Heparin Unfractionated: 0.28 IU/mL — ABNORMAL LOW (ref 0.30–0.70)
Heparin Unfractionated: 0.22 IU/mL — ABNORMAL LOW (ref 0.30–0.70)

## 2016-08-29 LAB — FOLATE RBC
FOLATE, HEMOLYSATE: 447.2 ng/mL
Folate, RBC: 1856 ng/mL (ref >498)
HEMATOCRIT: 24.1 % — AB (ref 37.5–51.0)

## 2016-08-29 LAB — CBC
HEMATOCRIT: 25.5 % — AB (ref 39.0–52.0)
Hemoglobin: 8 g/dL — ABNORMAL LOW (ref 13.0–17.0)
MCH: 28.8 pg (ref 26.0–34.0)
MCHC: 31.4 g/dL (ref 30.0–36.0)
MCV: 91.7 fL (ref 78.0–100.0)
Platelets: 191 10*3/uL (ref 150–400)
RBC: 2.78 MIL/uL — ABNORMAL LOW (ref 4.22–5.81)
RDW: 14.1 % (ref 11.5–15.5)
WBC: 8.5 10*3/uL (ref 4.0–10.5)

## 2016-08-29 MED ORDER — APIXABAN 5 MG PO TABS
10.0000 mg | ORAL_TABLET | Freq: Two times a day (BID) | ORAL | Status: DC
  Administered 2016-08-29 – 2016-08-30 (×2): 10 mg via ORAL
  Filled 2016-08-29 (×2): qty 2

## 2016-08-29 MED ORDER — HEPARIN BOLUS VIA INFUSION
1000.0000 [IU] | Freq: Once | INTRAVENOUS | Status: AC
  Administered 2016-08-29: 1000 [IU] via INTRAVENOUS
  Filled 2016-08-29: qty 1000

## 2016-08-29 MED ORDER — APIXABAN 5 MG PO TABS: 5.0000 mg | ORAL_TABLET | Freq: Two times a day (BID) | ORAL | Status: DC

## 2016-08-29 NOTE — Progress Notes (Signed)
Physical Therapy Treatment Patient Details Name: Tim GratesJohn A Haught Jr. MRN: [id number] DOB: [birth year] Today's Date: 08/29/2016    History of Present Illness  Tim GratesJohn A [name] is a 82 y.o. male with a history of diet-controlled diabetes, GERD, hypertension. Patient seen emergency department for left lower quadrant discomfort for 1 week on admit.  Positive for NSTEMI.  Pt found to have LLE DVT on 08/28/16 and started on IV heparin.    PT Comments    Pt with new LLE DVT and increased LLE pain and inability to put weight on it. Pt's progress will be slowed by this. Continue to recommend ST-SNF.  Follow Up Recommendations  SNF;Supervision/Assistance - 24 hour     Equipment Recommendations  Rolling walker with 5" wheels    Recommendations for Other Services       Precautions / Restrictions Precautions Precautions: Fall Restrictions Weight Bearing Restrictions: No    Mobility  Bed Mobility               General bed mobility comments: Pt up in chair  Transfers Overall transfer level: Needs assistance Equipment used: Rolling walker (2 wheeled) Transfers: Sit to/from Stand Sit to Stand: Mod assist;+2 safety/equipment         General transfer comment: Assist to bring hips up. Pt unable to put much weight on LLE due to pain.  Ambulation/Gait             General Gait Details: Unable to amb due to inability to put weight on LLE due to pain   Stairs            Wheelchair Mobility    Modified Rankin (Stroke Patients Only)       Balance                                    Cognition Arousal/Alertness: Awake/alert Behavior During Therapy: WFL for tasks assessed/performed Overall Cognitive Status: Within Functional Limits for tasks assessed                      Exercises      General Comments        Pertinent Vitals/Pain Pain Assessment: Faces Faces Pain Scale: Hurts whole lot Pain Location: Lt leg below the knee Pain  Descriptors / Indicators: Grimacing;Guarding;Aching Pain Intervention(s): Limited activity within patient's tolerance;Monitored during session    Home Living                      Prior Function            PT Goals (current goals can now be found in the care plan section) Acute Rehab PT Goals Patient Stated Goal: to go home Progress towards PT goals: Not progressing toward goals - comment (due to pain in LLE due to new DVT)    Frequency    Min 3X/week      PT Plan Current plan remains appropriate    Co-evaluation             End of Session Equipment Utilized During Treatment: Gait belt Activity Tolerance: Patient limited by pain Patient left: with call bell/phone within reach;in chair;with chair alarm set     Time: [id number] PT Time Calculation (min) (ACUTE ONLY): 14 min  Charges:  $Therapeutic Activity: 8-22 mins  G Codes:      Madesyn Ast 08/29/2016, 2:14 PM [facility] PT [phone]

## 2016-08-29 NOTE — Progress Notes (Signed)
   VASCULAR SURGERY ASSESSMENT & PLAN:  DVT left calf and superficial thrombophlebitis involving left lesser saphenous vein. I would recommend anticoagulation for 6 weeks. I will arrange for a follow up duplex in my office in 6 weeks.  Would continue elevation, warm compresses, and anticoagulation.  Vascular surgery will be available as needed.  SUBJECTIVE: Still with significant left calf pain.  PHYSICAL EXAM: Vitals:   08/28/16 1341 08/28/16 1950 08/29/16 0511 08/29/16 1020  BP: (!) 126/50 (!) 116/51 (!) 109/45 (!) 109/40  Pulse: 83 82 72 74  Resp: 20 20 20    Temp: 98.4 F (36.9 C) 98.4 F (36.9 C) 98.6 F (37 C)   TempSrc: Axillary Oral Oral   SpO2: 100% 97% 97%   Weight:      Height:       Moderate swelling left leg. Continues to have moderate left calf tenderness.  LABS: Lab Results  Component Value Date   WBC 8.5 08/29/2016   HGB 8.0 (L) 08/29/2016   HCT 25.5 (L) 08/29/2016   MCV 91.7 08/29/2016   PLT 191 08/29/2016   Lab Results  Component Value Date   CREATININE 1.42 (H) 08/29/2016   Lab Results  Component Value Date   INR 1.28 08/23/2016   CBG (last 3)  No results for input(s): GLUCAP in the last 72 hours.  Principal Problem:   NSTEMI (non-ST elevated myocardial infarction) (HCC) Active Problems:   Diabetes mellitus without complication (HCC)   Essential hypertension   Renal insufficiency   Cardiomyopathy, ischemic   Left main coronary artery disease   Acute systolic heart failure (HCC)   Hypokalemia   Anemia   Leg pain, left    Cari CarawayChris Ogle Hoeffner Beeper: [phone] 08/29/2016

## 2016-08-29 NOTE — Progress Notes (Signed)
PROGRESS NOTE    Tim GratesJohn A Somerville Jr.  WUJ:[id number] DOB: 1928/10/04 DOA: 08/19/2016 PCP: Ignatius Speckinghruv B Vyas, MD   Brief Narrative: Tim GratesJohn A Scarberry Jr. is a 82 y.o. male with a history of diet-controlled diabetes, GERD, hypertension. Patient seen emergency department for left lower quadrant discomfort for 1 week, but worse last night. The discomfort is vague and is intermittent. Pain is resolved now. Additionally, the patient has several months of burning of her chest pain, nonexertional in nature.  Some radiation and jaw and shoulder. Pain last for approximately 30 minutes and then is resolved. Occurs several times a week, but not daily. Takes bicarbonate, Tums which helps with the discomfort occasionally.  Course in emergency department: CT of the abdomen is normal. Patient had elevated troponins: 0.72 and 0.78 3 hours after first draw. Patient had nitroglycerin and aspirin which completely resolved his chest pain.  Assessment & Plan:   Principal Problem:   NSTEMI (non-ST elevated myocardial infarction) (HCC) Active Problems:   Diabetes mellitus without complication (HCC)   Essential hypertension   Renal insufficiency   Cardiomyopathy, ischemic   Left main coronary artery disease   Acute systolic heart failure (HCC)   Hypokalemia   Anemia   Leg pain, left  1-N-STEMI with reduced lvef (likely acute systolic chf) Presents with chest pain, mild elevation of troponin, EKG changes. Echo with reduced ef at 20--25%, with lower extremity edema, patient received iv lasix and  heparin drip initially on admission 9/19, Cardiac cath with sever multivessel disease including 95-99% distal left main stenosis with lad and rca lesions, he was transferred to cardiac ICU on balloon pump and swan-ganz monitoring , thoracic surgery consulted who advised against surgery,  9/20 he was brought back to cath lab underwent coronary stent to LM With impella  Currently on plavix/asa/ betablocker and statin. Cardiology  restarted lisinopril on 9/22 Activity level per cardiology, patient may transfer to med tele if oks with cardiology  2. Anemia: possible from hematuria S/p prbc transfusion on 9/20, hgn stable after transfusion, hematuria seem has improved, on iron supplement  3. Diabetes -  Continue diet control, a1c 6.2  4. chronic kidney disease stage 3;  Per record last cr at 1.8.  Cr close to baseline, renal dosing meds   5. Hypertension Stable on betablocker, acei held since admission, cardiology to decide when to start patient back on lisinopril.  6. GERD Started  Protonix  7-Abdominal pain; CT scan negative for acute pathology.  Pain improved.   8. Hypokalemia: replace k  9. Left leg pain:  Left lower extremity DVT: deep vein thrombosis involving one of the left paired peroneal veins And  superficial thrombosis involving the left lesser saphenous vein Vascular surgery Dr Edilia Boickson input appreciated, per Dr Edilia Boickson "anticoagulation for 6 weeks. I will arrange for a follow up duplex in my office in 6 weeks."  I have discussed with cardiology Dr Rennis GoldenHilty who recommend apixaban for 6 weeks.   DVT prophylaxis: Heparin /apixaban Code Status: Full code,  Family Communication: discussed with patient.  Disposition Plan: SNF with cardiology [facility]   Consultants:   Cardiology    Procedures:  9/19 diagnostic cardiac cath with balloon pump and swan-ganz placement  9/20 pci with impella  prbc transfusion on 9/20  Antimicrobials:  none  Subjective: Sitting in chair, less left posterior leg pain, denies chest pain, no sob, no hematuria   Objective: Vitals:   08/28/16 1950 08/29/16 0511 08/29/16 1020 08/29/16 1242  BP: (!) 116/51 (!) 109/45 Marland Kitchen(!)  109/40 (!) 111/43  Pulse: 82 72 74 80  Resp: 20 20    Temp: 98.4 F (36.9 C) 98.6 F (37 C)    TempSrc: Oral Oral    SpO2: 97% 97%    Weight:      Height:        Intake/Output Summary (Last 24 hours) at 08/29/16 1403 Last data  filed at 08/29/16 0830  Gross per 24 hour  Intake           509.31 ml  Output              700 ml  Net          -190.69 ml   Filed Weights   08/19/16 1034 08/23/16 0541 08/23/16 2000  Weight: 103.9 kg (229 lb) 103 kg (227 lb) 103.6 kg (228 lb 6.3 oz)    Examination:  General exam: comfortable Respiratory system: Clear to auscultation. Respiratory effort normal. Cardiovascular system: S1 & S2 heard, RRR. No JVD, murmurs, rubs, gallops or clicks. Mild bilateral pedal edema. Gastrointestinal system: Abdomen is nondistended, soft and nontender. No organomegaly or masses felt. Normal bowel sounds heard. Central nervous system: Alert and oriented. No focal neurological deficits. Extremities: left lower extremity tenderness to palpation to posterior aspect,  +pedal Edema on left , does has scattered ecchymosis.  Skin: No rashes, lesions or ulcers Psychiatry: Judgement and insight appear normal. Mood & affect appropriate.     Data Reviewed: I have personally reviewed following labs and imaging studies  CBC:  Recent Labs Lab 08/24/16 2237 08/25/16 0214 08/26/16 0236 08/28/16 0303 08/29/16 0244  WBC 8.4 8.5 7.6 8.1 8.5  HGB 8.6* 8.7* 8.2* 8.3* 8.0*  HCT 27.0* 27.0* 25.0* 25.6* 25.5*  MCV 91.5 90.3 90.6 90.1 91.7  PLT 134* 133* 140* 183 191   Basic Metabolic Panel:  Recent Labs Lab 08/25/16 0214 08/26/16 0236 08/27/16 0253 08/28/16 0303 08/29/16 0244  NA 139 134* 138 138 134*  K 3.5 3.2* 4.2 3.9 4.1  CL 106 103 105 105 105  CO2 23 27 20* 25 22  GLUCOSE 142* 133* 128* 146* 159*  BUN 24* 24* 27* 28* 26*  CREATININE 1.63* 1.73* 1.59* 1.51* 1.42*  CALCIUM 7.9* 7.8* 8.3* 8.4* 8.1*  MG  --  1.8 1.8  --   --    GFR: Estimated Creatinine Clearance: 47.2 mL/min (by C-G formula based on SCr of 1.42 mg/dL (H)). Liver Function Tests: No results for input(s): AST, ALT, ALKPHOS, BILITOT, PROT, ALBUMIN in the last 168 hours. No results for input(s): LIPASE, AMYLASE in the last  168 hours. No results for input(s): AMMONIA in the last 168 hours. Coagulation Profile:  Recent Labs Lab 08/23/16 0445  INR 1.28   Cardiac Enzymes: No results for input(s): CKTOTAL, CKMB, CKMBINDEX, TROPONINI in the last 168 hours. BNP (last 3 results) No results for input(s): PROBNP in the last 8760 hours. HbA1C: No results for input(s): HGBA1C in the last 72 hours. CBG:  Recent Labs Lab 08/23/16 2058 08/24/16 0005 08/24/16 0406  GLUCAP 108* 105* 94   Lipid Profile: No results for input(s): CHOL, HDL, LDLCALC, TRIG, CHOLHDL, LDLDIRECT in the last 72 hours. Thyroid Function Tests: No results for input(s): TSH, T4TOTAL, FREET4, T3FREE, THYROIDAB in the last 72 hours. Anemia Panel: No results for input(s): VITAMINB12, FOLATE, FERRITIN, TIBC, IRON, RETICCTPCT in the last 72 hours. Sepsis Labs: No results for input(s): PROCALCITON, LATICACIDVEN in the last 168 hours.  Recent Results (from the past 240 hour(s))  MRSA PCR Screening     Status: None   Collection Time: 08/23/16 10:31 PM  Result Value Ref Range Status   MRSA by PCR NEGATIVE NEGATIVE Final    Comment:        The GeneXpert MRSA Assay (FDA approved for NASAL specimens only), is one component of a comprehensive MRSA colonization surveillance program. It is not intended to diagnose MRSA infection nor to guide or monitor treatment for MRSA infections.          Radiology Studies: Dg Tibia/fibula Left  Result Date: 08/27/2016 EXAM: LEFT TIBIA AND FIBULA - 2 VIEW COMPARISON:  None. FINDINGS: No fracture or bone lesion. Knee joint is normally aligned. There is moderate to marked lateral compartment narrowing with marginal osteophytes. Ankle joint is normally aligned. Bones are diffusely demineralized. There is diffuse nonspecific subcutaneous soft tissue edema. IMPRESSION: 1. No fracture or acute bony abnormality. 2. Degenerative changes of the knee.  Ankle joint normally aligned. Electronically Signed   By:  Amie Portland M.D.   On: 08/27/2016 15:21        Scheduled Meds: . aspirin  81 mg Oral Daily  . atorvastatin  40 mg Oral q1800  . clopidogrel  75 mg Oral Q breakfast  . ferrous sulfate  325 mg Oral BID WC  . finasteride  5 mg Oral Daily  . gabapentin  100 mg Oral BID  . heparin  1,000 Units Intravenous Once  . lisinopril  5 mg Oral Daily  . metoprolol tartrate  25 mg Oral BID  . pantoprazole  40 mg Oral Daily  . sodium chloride flush  3 mL Intravenous Q12H  . sodium chloride flush  3 mL Intravenous Q12H  . sodium chloride flush  3 mL Intravenous Q12H  . terazosin  5 mg Oral Daily   Continuous Infusions: . sodium chloride 10 mL/hr at 08/29/16 1034  . heparin 1,000 Units/hr (08/29/16 1244)     LOS: 10 days    Time spent: 25 minutes.     Tim Grates, MD PhD [facility] Pager [phone]  If 7PM-7AM, please contact night-coverage [email or web address] Password [email or web address] 08/29/2016, 2:03 PM

## 2016-08-29 NOTE — Progress Notes (Signed)
ANTICOAGULATION CONSULT NOTE - Follow Up Consult  Pharmacy Consult for heparin Indication: DVT  Labs:  Recent Labs  08/27/16 0253 08/28/16 0303 08/28/16 1827 08/29/16 0244  HGB  --  8.3*  --  8.0*  HCT  --  25.6*  --  25.5*  PLT  --  183  --  191  HEPARINUNFRC  --   --  0.12* 0.28*  CREATININE 1.59* 1.51*  --  1.42*    Assessment: 82yo male remains subtherapeutic on heparin after rate change though close to goal.  Goal of Therapy:  Heparin level 0.3-0.7 units/ml   Plan:  Will increase heparin gtt by 1 unit/kg/hr to 1000 units/hr and check level in 8hr.  Vernard GamblesVeronda Odessia Asleson, PharmD, BCPS  08/29/2016,3:25 AM

## 2016-08-29 NOTE — Evaluation (Signed)
Occupational Therapy Evaluation Patient Details Name: Tim Hopkins. MRN: [id number] DOB: 1928-05-08 Today's Date: 08/29/2016    History of Present Illness  Lester Crickenberger. is a 82 y.o. male with a history of diet-controlled diabetes, GERD, hypertension. Patient seen emergency department for left lower quadrant discomfort for 1 week on admit.  Positive for NSTEMI.  Pt found to have LLE DVT on 08/28/16 and started on IV heparin.   Clinical Impression   PTA, pt was independent with all ADLs and used [employer] for mobility. Pt currently requires mod +2 assist for basic transfers and LB dressing and bathing tasks due to severe LLE pain. Pt plans to d/c to a SNF for post-acute rehab and agree this is the most appropriate venue due to pt's lack of assistance at home. Pt will benefit from continued acute OT to increase independence and safety with ADLs and mobility. Will continue to follow acutely.    Follow Up Recommendations  SNF;Supervision/Assistance - 24 hour    Equipment Recommendations  Other (comment) (TBD at next venue of care)    Recommendations for Other Services       Precautions / Restrictions Precautions Precautions: Fall Restrictions Weight Bearing Restrictions: No      Mobility Bed Mobility Overal bed mobility: Needs Assistance;+2 for physical assistance Bed Mobility: Sit to Supine       Sit to supine: Mod assist;+2 for physical assistance   General bed mobility comments: Assist for trunk support and to bring LLE onto bed. Pt also requried +2 assist to pull up in bed. VCs for hand placement.  Transfers Overall transfer level: Needs assistance Equipment used: Rolling walker (2 wheeled) Transfers: Sit to/from [employer] Sit to Stand: Mod assist;+2 physical assistance Stand pivot transfers: Min assist;+2 physical assistance       General transfer comment: Mod +2 assist for boost to stand and to stabilize balance upon standing. VCs to scoot hips forward  to edge of chair, hand placement, and to bring hips forward once in standing. Min +2 assist for stand-pivot transfer primarily for balance and directional cues.    Balance Overall balance assessment: Needs assistance Sitting-balance support: No upper extremity supported;Feet supported Sitting balance-Leahy Scale: Fair     Standing balance support: Bilateral upper extremity supported;During functional activity Standing balance-Leahy Scale: Poor Standing balance comment: Heavy reliance on BUE support on RW to maintain balance                            ADL Overall ADL's : Needs assistance/impaired                 Upper Body Dressing : Set up;Sitting   Lower Body Dressing: Moderate assistance;+2 for physical assistance;Sit to/from stand   Toilet Transfer: Moderate assistance;+2 for physical assistance;Cueing for safety;Stand-pivot;BSC;RW   Toileting- Clothing Manipulation and Hygiene: Moderate assistance;+2 for physical assistance;Sit to/from stand       Functional mobility during ADLs: Moderate assistance;+2 for physical assistance;Rolling walker       Vision Vision Assessment?: No apparent visual deficits   Perception     Praxis      Pertinent Vitals/Pain Pain Assessment: 0-10 Pain Score: 10-Worst pain ever Faces Pain Scale: Hurts worst Pain Location: LLE Pain Descriptors / Indicators: Sharp;Sore Pain Intervention(s): Limited activity within patient's tolerance;Monitored during session;Repositioned     Hand Dominance Right   Extremity/Trunk Assessment Upper Extremity Assessment Upper Extremity Assessment: Overall WFL for tasks assessed  Lower Extremity Assessment Lower Extremity Assessment: LLE deficits/detail LLE Deficits / Details: decreased ROM and strength due to severe pain from DVT LLE: Unable to fully assess due to pain LLE Coordination: decreased gross motor;decreased fine motor   Cervical / Trunk Assessment Cervical / Trunk  Assessment: Kyphotic   Communication Communication Communication: No difficulties   Cognition Arousal/Alertness: Awake/alert Behavior During Therapy: WFL for tasks assessed/performed Overall Cognitive Status: Within Functional Limits for tasks assessed                     General Comments       Exercises       Shoulder Instructions      Home Living Family/patient expects to be discharged to:: Private residence Living Arrangements: Spouse/significant other Available Help at Discharge: Family;Neighbor;Available 24 hours/day (wife can provide supervision as she is [id number] and had MI recentl) Type of Home: House Home Access: Ramped entrance     Home Layout: One level     Bathroom Shower/Tub: Producer, television/film/videoWalk-in shower   Bathroom Toilet: Handicapped height     Home Equipment: Cane - single point;Grab bars - toilet;Grab bars - tub/shower;Shower seat;Hand held shower head          Prior Functioning/Environment Level of Independence: Independent with assistive device(s)        Comments: used cane PTA        OT Problem List: Decreased strength;Decreased range of motion;Decreased activity tolerance;Impaired balance (sitting and/or standing);Decreased coordination;Decreased knowledge of use of DME or AE;Decreased safety awareness;Obesity;Pain   OT Treatment/Interventions: Self-care/ADL training;Therapeutic exercise;DME and/or AE instruction;Therapeutic activities;Patient/family education;Balance training    OT Goals(Current goals can be found in the care plan section) Acute Rehab OT Goals Patient Stated Goal: to go home OT Goal Formulation: With patient Time For Goal Achievement: 09/12/16 Potential to Achieve Goals: Good ADL Goals Pt Will Perform Grooming: with min assist;standing Pt Will Perform Upper Body Bathing: sitting;with supervision Pt Will Perform Lower Body Bathing: with min assist;sit to/from stand Pt Will Transfer to Toilet: with min assist;ambulating;bedside  commode (over toilet) Pt Will Perform Toileting - Clothing Manipulation and hygiene: with min assist;sit to/from stand  OT Frequency: Min 2X/week   Barriers to D/C: Decreased caregiver support  Wife can only provide supervision       Co-evaluation              End of Session Equipment Utilized During Treatment: Gait belt;Rolling walker Nurse Communication: Mobility status  Activity Tolerance: Patient limited by pain Patient left: in bed;with bed alarm set;with call bell/phone within reach   Time: [id number] OT Time Calculation (min): 34 min Charges:  OT General Charges $OT Visit: 1 Procedure OT Evaluation $OT Eval Moderate Complexity: 1 Procedure OT Treatments $Self Care/Home Management : 8-22 mins G-Codes:    Nils PyleJulia Neelah Mannings, OTR/L Pager: [phone] 08/29/2016, 5:30 PM

## 2016-08-29 NOTE — Progress Notes (Signed)
ANTICOAGULATION CONSULT NOTE - Initial Consult  Pharmacy Consult for apixaban Indication: DVT  No Known Allergies  Patient Measurements: Height: 6\' 1"  (185.4 cm) Weight: 228 lb 6.3 oz (103.6 kg) IBW/kg (Calculated) : 79.9  Vital Signs: Temp: 98.6 F (37 C) (09/25 0511) Temp Source: Oral (09/25 0511) BP: 111/43 (09/25 1242) Pulse Rate: 80 (09/25 1242)  Labs:  Recent Labs  08/27/16 0253 08/28/16 0303 08/28/16 1827 08/29/16 0244 08/29/16 1138  HGB  --  8.3*  --  8.0*  --   HCT  --  25.6*  --  25.5*  --   PLT  --  183  --  191  --   HEPARINUNFRC  --   --  0.12* 0.28* 0.22*  CREATININE 1.59* 1.51*  --  1.42*  --    Estimated Creatinine Clearance: 47.2 mL/min (by C-G formula based on SCr of 1.42 mg/dL (H)).  Medical History: Past Medical History:  Diagnosis Date  . Arthritis   . Diabetes mellitus without complication (HCC)   . GERD (gastroesophageal reflux disease)    depending on diet  . Hypertension    Medications:  Prescriptions Prior to Admission  Medication Sig Dispense Refill Last Dose  . aspirin EC 81 MG tablet Take 81 mg by mouth daily.   08/18/2016 at Unknown time  . finasteride (PROSCAR) 5 MG tablet Take 1 tablet by mouth daily.  11 08/18/2016 at Unknown time  . lisinopril (PRINIVIL,ZESTRIL) 40 MG tablet Take 40 mg by mouth daily.   08/18/2016 at Unknown time  . terazosin (HYTRIN) 5 MG capsule Take 5 mg by mouth daily.   08/18/2016 at Unknown time   Assessment: Tim Hopkins is an [age] YOM with recent unprotected LM stent and residual RCA disease as well as diagnosis of LLE DVT. Patient has high risk of mortality if he were to develop LM stent thrombosis, therefore cardiology recommends to remain on aspirin and clopidogrel for prophylaxis. Pharmacy has been consulted to dose apixaban for treatment of the DVT. Risks and benefits have been considered and it is felt that the full treatment dose must be used at this time. Patients CrCl is approximately 40 mL/min. Dose  reductions for renal function are not warranted when dosing apixaban for DVT. Discussed dosing strategy with Dr. Rennis GoldenHilty and he agrees with the provided plan. The patient has been on a heparin infusion since yesterday, but has been subtherapeutic. Heparin will be discontinued at at the time of apixaban administration. Will follow patient closely for signs and symptoms of bleeding.  Goal of Therapy:  aPTT 66-102 seconds Monitor platelets by anticoagulation protocol: Yes   Plan:  Apixaban 10 mg BID for 7 days, followed for apixaban 5 mg BID for at least 5 weeks Monitor signs and symptoms of bleeding  Tim Hopkins, PharmD Clinical Pharmacist Pager: [phone] 08/29/2016 3:58 PM

## 2016-08-29 NOTE — Progress Notes (Signed)
DAILY PROGRESS NOTE  Subjective:  Tired. No pain. Diagnosed with DVT of LLE. Recent unprotected LM stent.  Objective:  Temp:  [98.4 F (36.9 C)-98.6 F (37 C)] 98.6 F (37 C) (09/25 0511) Pulse Rate:  [72-82] 80 (09/25 1242) Resp:  [20] 20 (09/25 0511) BP: (109-116)/(40-51) 111/43 (09/25 1242) SpO2:  [97 %] 97 % (09/25 0511) Weight change:   Intake/Output from previous day: 09/24 0701 - 09/25 0700 In: 269.3 [I.V.:149.3] Out: 700 [Urine:700]  Intake/Output from this shift: Total I/O In: 240 [P.O.:240] Out: -   Medications: No current facility-administered medications on file prior to encounter.    Current Outpatient Prescriptions on File Prior to Encounter  Medication Sig Dispense Refill  . aspirin EC 81 MG tablet Take 81 mg by mouth daily.    Marland Kitchen lisinopril (PRINIVIL,ZESTRIL) 40 MG tablet Take 40 mg by mouth daily.    Marland Kitchen terazosin (HYTRIN) 5 MG capsule Take 5 mg by mouth daily.      Physical Exam: General appearance: alert, fatigued and no distress Lungs: clear to auscultation bilaterally Heart: regular rate and rhythm Extremities: edema 2+ LLE Neurologic: Grossly normal  Lab Results: Results for orders placed or performed during the hospital encounter of 08/19/16 (from the past 48 hour(s))  Basic metabolic panel     Status: Abnormal   Collection Time: 08/28/16  3:03 AM  Result Value Ref Range   Sodium 138 135 - 145 mmol/L   Potassium 3.9 3.5 - 5.1 mmol/L   Chloride 105 101 - 111 mmol/L   CO2 25 22 - 32 mmol/L   Glucose, Bld 146 (H) 65 - 99 mg/dL   BUN 28 (H) 6 - 20 mg/dL   Creatinine, Ser 1.51 (H) 0.61 - 1.24 mg/dL   Calcium 8.4 (L) 8.9 - 10.3 mg/dL   GFR calc non Af Amer 40 (L) >60 mL/min   GFR calc Af Amer 46 (L) >60 mL/min    Comment: (NOTE) The eGFR has been calculated using the CKD EPI equation. This calculation has not been validated in all clinical situations. eGFR's persistently <60 mL/min signify possible Chronic Kidney Disease.    Anion  gap 8 5 - 15  CBC     Status: Abnormal   Collection Time: 08/28/16  3:03 AM  Result Value Ref Range   WBC 8.1 4.0 - 10.5 K/uL   RBC 2.84 (L) 4.22 - 5.81 MIL/uL   Hemoglobin 8.3 (L) 13.0 - 17.0 g/dL   HCT 25.6 (L) 39.0 - 52.0 %   MCV 90.1 78.0 - 100.0 fL   MCH 29.2 26.0 - 34.0 pg   MCHC 32.4 30.0 - 36.0 g/dL   RDW 14.4 11.5 - 15.5 %   Platelets 183 150 - 400 K/uL  Heparin level (unfractionated)     Status: Abnormal   Collection Time: 08/28/16  6:27 PM  Result Value Ref Range   Heparin Unfractionated 0.12 (L) 0.30 - 0.70 IU/mL    Comment:        IF HEPARIN RESULTS ARE BELOW EXPECTED VALUES, AND PATIENT DOSAGE HAS BEEN CONFIRMED, SUGGEST FOLLOW UP TESTING OF ANTITHROMBIN III LEVELS.   Basic metabolic panel     Status: Abnormal   Collection Time: 08/29/16  2:44 AM  Result Value Ref Range   Sodium 134 (L) 135 - 145 mmol/L   Potassium 4.1 3.5 - 5.1 mmol/L   Chloride 105 101 - 111 mmol/L   CO2 22 22 - 32 mmol/L   Glucose, Bld 159 (H) 65 -  99 mg/dL   BUN 26 (H) 6 - 20 mg/dL   Creatinine, Ser 1.42 (H) 0.61 - 1.24 mg/dL   Calcium 8.1 (L) 8.9 - 10.3 mg/dL   GFR calc non Af Amer 43 (L) >60 mL/min   GFR calc Af Amer 50 (L) >60 mL/min    Comment: (NOTE) The eGFR has been calculated using the CKD EPI equation. This calculation has not been validated in all clinical situations. eGFR's persistently <60 mL/min signify possible Chronic Kidney Disease.    Anion gap 7 5 - 15  Heparin level (unfractionated)     Status: Abnormal   Collection Time: 08/29/16  2:44 AM  Result Value Ref Range   Heparin Unfractionated 0.28 (L) 0.30 - 0.70 IU/mL    Comment:        IF HEPARIN RESULTS ARE BELOW EXPECTED VALUES, AND PATIENT DOSAGE HAS BEEN CONFIRMED, SUGGEST FOLLOW UP TESTING OF ANTITHROMBIN III LEVELS.   CBC     Status: Abnormal   Collection Time: 08/29/16  2:44 AM  Result Value Ref Range   WBC 8.5 4.0 - 10.5 K/uL   RBC 2.78 (L) 4.22 - 5.81 MIL/uL   Hemoglobin 8.0 (L) 13.0 - 17.0 g/dL    HCT 25.5 (L) 39.0 - 52.0 %   MCV 91.7 78.0 - 100.0 fL   MCH 28.8 26.0 - 34.0 pg   MCHC 31.4 30.0 - 36.0 g/dL   RDW 14.1 11.5 - 15.5 %   Platelets 191 150 - 400 K/uL  Heparin level (unfractionated)     Status: Abnormal   Collection Time: 08/29/16 11:38 AM  Result Value Ref Range   Heparin Unfractionated 0.22 (L) 0.30 - 0.70 IU/mL    Comment:        IF HEPARIN RESULTS ARE BELOW EXPECTED VALUES, AND PATIENT DOSAGE HAS BEEN CONFIRMED, SUGGEST FOLLOW UP TESTING OF ANTITHROMBIN III LEVELS.     Imaging: Dg Tibia/fibula Left  Result Date: 08/27/2016 EXAM: LEFT TIBIA AND FIBULA - 2 VIEW COMPARISON:  None. FINDINGS: No fracture or bone lesion. Knee joint is normally aligned. There is moderate to marked lateral compartment narrowing with marginal osteophytes. Ankle joint is normally aligned. Bones are diffusely demineralized. There is diffuse nonspecific subcutaneous soft tissue edema. IMPRESSION: 1. No fracture or acute bony abnormality. 2. Degenerative changes of the knee.  Ankle joint normally aligned. Electronically Signed   By: Lajean Manes M.D.   On: 08/27/2016 15:21    Assessment:  1. Principal Problem: 2.   NSTEMI (non-ST elevated myocardial infarction) ([place]) 3. Active Problems: 4.   Diabetes mellitus without complication ([place]) 5.   Essential hypertension 6.   Renal insufficiency 7.   Cardiomyopathy, ischemic 8.   Left main coronary artery disease 9.   Acute systolic heart failure ([place]) 10.   Hypokalemia 11.   Anemia 12.   Leg pain, left 13.   Plan:  1. Difficult situation with recent unprotected LM stent and residual RCA disease as well as diagnosis of LLE DVT. Will need at least 6 weeks of anticoagulation for DVT per Dr. Scot Dock. Discussed case with Dr. Burt Knack and given the high risk of mortality if he were to develop LM stent thrombosis, he would need to remain on aspirin and plavix. Therefore, he will need "triple therapy". Would suggest we add Xarelto for DVT. Will ask  pharmacy to aid with dosing, although, likely he will need full dose as creatinine has recently improved to <1.5.  Once on anticoagulation, he should be able to transfer  to rehab tomorrow from a cardiac standpoint.  Time Spent Directly with Patient:  15 minutes  Length of Stay:  LOS: 10 days   Pixie Casino, MD, [facility] Attending Cardiologist [place] 08/29/2016, 2:15 PM

## 2016-08-29 NOTE — Care Management Important Message (Signed)
Important Message  Patient Details  Name: Tim GratesJohn A Biehler Jr. MRN: [id number] Date of Birth: 01/22/1928   Medicare Important Message Given:  Yes    Mitali Shenefield Abena 08/29/2016, 11:01 AM

## 2016-08-29 NOTE — Progress Notes (Signed)
ANTICOAGULATION CONSULT NOTE  Pharmacy Consult for Heparin Indication: new DVT in LLE  No Known Allergies  Patient Measurements: Height: 6\' 1"  (185.4 cm) Weight: 228 lb 6.3 oz (103.6 kg) IBW/kg (Calculated) : 79.9  Vital Signs: Temp: 98.6 F (37 C) (09/25 0511) Temp Source: Oral (09/25 0511) BP: 111/43 (09/25 1242) Pulse Rate: 80 (09/25 1242)  Labs:  Recent Labs  08/27/16 0253 08/28/16 0303 08/28/16 1827 08/29/16 0244 08/29/16 1138  HGB  --  8.3*  --  8.0*  --   HCT  --  25.6*  --  25.5*  --   PLT  --  183  --  191  --   HEPARINUNFRC  --   --  0.12* 0.28* 0.22*  CREATININE 1.59* 1.51*  --  1.42*  --     Estimated Creatinine Clearance: 47.2 mL/min (by C-G formula based on SCr of 1.42 mg/dL (H)).   Assessment: [age] yoM previous on heparin for NSTEMI, now s/p PCI with IABP (now removed), not a candidate for CABG per CT surg, now s/p repeat PCI/temporary Impella. Heparin was transitioned to prophylaxis post-op on 9/22, but now patient found to have LLE DVT, and Pharmacy consulted to resume heparin IV.   Noted previously with concern for significant penile bleeding with condom cath and Hgb drop on heparin, but per MD notes, hematuria has cleared. Hg low stable 8.0, plt up to wnl. No bleeding or IV line issues reported.  Heparin level subtherapeutic   Goal of Therapy:  Heparin level 0.2-0.5 units/ml Monitor platelets by anticoagulation protocol: Yes   Plan:  Bolus 1000 units IV x 1 dose Increase heparin drip to 1150 units/hr 8 hour confirmatory Heparin level Daily heparin level/CBC Monitor closely for s/sx bleeding F/u long-term anticoagulation plan  Ruben Imony Etta Gassett, PharmD Clinical Pharmacist Pager: [phone] 08/29/2016 1:46 PM

## 2016-08-30 LAB — CBC
HCT: 24.3 % — ABNORMAL LOW (ref 39.0–52.0)
HEMOGLOBIN: 7.8 g/dL — AB (ref 13.0–17.0)
MCH: 29.3 pg (ref 26.0–34.0)
MCHC: 32.1 g/dL (ref 30.0–36.0)
MCV: 91.4 fL (ref 78.0–100.0)
PLATELETS: 224 10*3/uL (ref 150–400)
RBC: 2.66 MIL/uL — AB (ref 4.22–5.81)
RDW: 14.1 % (ref 11.5–15.5)
WBC: 9 10*3/uL (ref 4.0–10.5)

## 2016-08-30 LAB — BASIC METABOLIC PANEL
ANION GAP: 7 (ref 5–15)
BUN: 25 mg/dL — ABNORMAL HIGH (ref 6–20)
CHLORIDE: 103 mmol/L (ref 101–111)
CO2: 24 mmol/L (ref 22–32)
CREATININE: 1.52 mg/dL — AB (ref 0.61–1.24)
Calcium: 8.2 mg/dL — ABNORMAL LOW (ref 8.9–10.3)
GFR calc non Af Amer: 40 mL/min — ABNORMAL LOW (ref >60)
GFR, EST AFRICAN AMERICAN: 46 mL/min — AB (ref >60)
Glucose, Bld: 150 mg/dL — ABNORMAL HIGH (ref 65–99)
POTASSIUM: 4 mmol/L (ref 3.5–5.1)
SODIUM: 134 mmol/L — AB (ref 135–145)

## 2016-08-30 MED ORDER — OXYCODONE-ACETAMINOPHEN 5-325 MG PO TABS: 1.0000 | ORAL_TABLET | Freq: Three times a day (TID) | ORAL | 0 refills | Status: DC | PRN

## 2016-08-30 MED ORDER — CLOPIDOGREL BISULFATE 75 MG PO TABS: 75.0000 mg | ORAL_TABLET | Freq: Every day | ORAL | 0 refills | Status: AC

## 2016-08-30 MED ORDER — LISINOPRIL 5 MG PO TABS: 5.0000 mg | ORAL_TABLET | Freq: Every day | ORAL | 0 refills | Status: DC

## 2016-08-30 MED ORDER — PANTOPRAZOLE SODIUM 40 MG PO TBEC: 40.0000 mg | DELAYED_RELEASE_TABLET | Freq: Every day | ORAL | 0 refills | Status: AC

## 2016-08-30 MED ORDER — FERROUS SULFATE 325 (65 FE) MG PO TABS: 325.0000 mg | ORAL_TABLET | Freq: Every day | ORAL | 0 refills | Status: AC

## 2016-08-30 MED ORDER — TAMSULOSIN HCL 0.4 MG PO CAPS: 0.8000 mg | ORAL_CAPSULE | Freq: Every day | ORAL | 0 refills | Status: DC

## 2016-08-30 MED ORDER — ATORVASTATIN CALCIUM 40 MG PO TABS: 40.0000 mg | ORAL_TABLET | Freq: Every day | ORAL | 0 refills | Status: DC

## 2016-08-30 MED ORDER — METOPROLOL TARTRATE 25 MG PO TABS: 25.0000 mg | ORAL_TABLET | Freq: Two times a day (BID) | ORAL | 0 refills | Status: DC

## 2016-08-30 MED ORDER — APIXABAN 5 MG PO TABS: ORAL_TABLET | ORAL | 0 refills | Status: DC

## 2016-08-30 MED ORDER — SENNOSIDES-DOCUSATE SODIUM 8.6-50 MG PO TABS: 1.0000 | ORAL_TABLET | Freq: Every day | ORAL | 0 refills | Status: AC

## 2016-08-30 NOTE — Discharge Summary (Signed)
Discharge Summary  Tim Hopkins. WUJ:[id number] DOB: 1928-06-13  PCP: Ignatius Specking, MD  Admit date: 08/19/2016 Discharge date: 08/30/2016  Time spent: >36mins  Recommendations for Outpatient Follow-up:  1. F/u with SNF MD for hospital discharge follow up, repeat cbc/bmp at follow up,  patient will need to be on asa/plavix/elliquis, he also has hematuria, transfused prn to keep hgb >8 2. F/u with cardiology Dr Excell Seltzer, patient will need to have staged cardiac cath 3. F/u with vascular surgery Dr Edilia Bo for repeat venous doppler left lower extremity in 6 weeks  Discharge Diagnoses:  Active Hospital Problems   Diagnosis Date Noted  . NSTEMI (non-ST elevated myocardial infarction) (HCC) 08/19/2016  . Leg pain, left   . Anemia 08/27/2016  . Hypokalemia   . Cardiomyopathy, ischemic   . Left main coronary artery disease   . Acute systolic heart failure (HCC)   . Renal insufficiency   . Diabetes mellitus without complication (HCC)   . Essential hypertension     Resolved Hospital Problems   Diagnosis Date Noted Date Resolved  . Elevated troponin 08/19/2016 08/27/2016  . Chronic kidney disease (CKD), stage III (moderate) 08/19/2016 08/27/2016  . Chest pain 08/19/2016 08/27/2016  . Esophageal reflux  08/27/2016    Discharge Condition: stable  Diet recommendation: heart healthy  Filed Weights   08/19/16 1034 08/23/16 0541 08/23/16 2000  Weight: 103.9 kg (229 lb) 103 kg (227 lb) 103.6 kg (228 lb 6.3 oz)    History of present illness:  Chief Complaint: Abdominal pain, chest pain  HPI: Tim Curci. is a 82 y.o. male with a history of diet-controlled diabetes, GERD, hypertension. Patient seen emergency department for left lower quadrant discomfort for 1 week, but worse last night. The discomfort is vague and is intermittent. Pain is resolved now. Additionally, the patient has several months of burning of her chest pain, nonexertional in nature.  Some radiation and jaw and  shoulder. Pain last for approximately 30 minutes and then is resolved. Occurs several times a week, but not daily. Takes bicarbonate, Tums which helps with the discomfort occasionally.  Course in emergency department: CT of the abdomen is normal. Patient had elevated troponins: 0.72 and 0.78 3 hours after first draw. Patient had nitroglycerin and aspirin which completely resolved his chest pain.  Hospital Course:  Principal Problem:   NSTEMI (non-ST elevated myocardial infarction) (HCC) Active Problems:   Diabetes mellitus without complication (HCC)   Essential hypertension   Renal insufficiency   Cardiomyopathy, ischemic   Left main coronary artery disease   Acute systolic heart failure (HCC)   Hypokalemia   Anemia   Leg pain, left   1-N-STEMI with reduced lvef (likely acute systolic chf) Presents with chest pain, mild elevation of troponin, EKG changes. Echo with reduced ef at 20--25%, with lower extremity edema, patient received iv lasix and  heparin drip initially on admission 9/19, Cardiac cath with sever multivessel disease including 95-99% distal left main stenosis with lad and rca lesions, he was transferred to cardiac ICU on balloon pump and swan-ganz monitoring , thoracic surgery consulted who advised against surgery,  9/20 he was brought back to cath lab underwent coronary stent to LM With impella  Currently on plavix/asa/ betablocker and statin. Cardiology restarted low dose lisinopril on 9/22 Patient will need follow up with cardiology closely  2. Anemia: possible from hematuria S/p prbc transfusion on 9/20, hgn stable after transfusion, hematuria seem has improved, started iron supplement Patient likely will need prn transfusion  in the next 6 weeks while he is on asa/plavix/elliquis.   3. Diabetes -  Continue diet control, a1c 6.2  4. chronic kidney disease stage 3;  Per record last cr at 1.8.  Cr close to baseline, renal dosing meds   5.  Hypertension Stable on betablocker, acei held initially, restarted at low dose at discharge  6. GERD Started  Protonix  7-Abdominal pain; CT scan negative for acute pathology.  Pain improved.   8. Hypokalemia: replace k  9. Left leg pain:  Left lower extremity DVT: deep vein thrombosis involving one of the left paired peroneal veins And  superficial thrombosis involving the left lesser saphenous vein Vascular surgery Dr Edilia Bo input appreciated, per Dr Edilia Bo "anticoagulation for 6 weeks. I will arrange for a follow up duplex in my office in 6 weeks."  I have discussed with cardiology Dr Rennis Golden who recommend apixaban for 6 weeks.   DVT prophylaxis while in the : Heparin  Then apixaban Code Status: Full code,  Family Communication: discussed with patient.  Disposition Plan: SNF with cardiology [place]   Consultants:   Cardiology    Procedures:  9/19 diagnostic cardiac cath with balloon pump and swan-ganz placement  9/20 pci with impella  prbc transfusion on 9/20  Antimicrobials:  none    Discharge Exam: BP (!) 126/51 (BP Location: Left Arm)   Pulse 79   Temp 98.9 F (37.2 C) (Oral)   Resp 18   Ht 6\' 1"  (1.854 m)   Wt 103.6 kg (228 lb 6.3 oz)   SpO2 94%   BMI 30.13 kg/m    General exam: comfortable Respiratory system: Clear to auscultation. Respiratory effort normal. Cardiovascular system: S1 & S2 heard, RRR. No JVD, murmurs, rubs, gallops or clicks. Mild bilateral pedal edema. Gastrointestinal system: Abdomen is nondistended, soft and nontender. No organomegaly or masses felt. Normal bowel sounds heard. Central nervous system: Alert and oriented. No focal neurological deficits. Extremities: left lower extremity tenderness to palpation to posterior aspect,  +pedal Edema on left , does has scattered ecchymosis.  Skin: No rashes, lesions or ulcers Psychiatry: Judgement and insight appear normal. Mood & affect appropriate.     Discharge  Instructions You were cared for by a hospitalist during your hospital stay. If you have any questions about your discharge medications or the care you received while you were in the hospital after you are discharged, you can call the unit and asked to speak with the hospitalist on call if the hospitalist that took care of you is not available. Once you are discharged, your primary care physician will handle any further medical issues. Please note that NO REFILLS for any discharge medications will be authorized once you are discharged, as it is imperative that you return to your primary care physician (or establish a relationship with a primary care physician if you do not have one) for your aftercare needs so that they can reassess your need for medications and monitor your lab values.  Discharge Instructions    Diet - low sodium heart healthy    Complete by:  As directed    Increase activity slowly    Complete by:  As directed        Medication List    STOP taking these medications   terazosin 5 MG capsule Commonly known as:  HYTRIN     TAKE these medications   apixaban 5 MG Tabs tablet Commonly known as:  ELIQUIS Take 10mg  (two tabs) twice a day from now  to 10/1, then take 5mg  twice a day for total of 5 weeks.   aspirin EC 81 MG tablet Take 81 mg by mouth daily.   atorvastatin 40 MG tablet Commonly known as:  LIPITOR Take 1 tablet (40 mg total) by mouth daily at 6 PM.   clopidogrel 75 MG tablet Commonly known as:  PLAVIX Take 1 tablet (75 mg total) by mouth daily with breakfast. Start taking on:  08/31/2016   ferrous sulfate 325 (65 FE) MG tablet Take 1 tablet (325 mg total) by mouth daily with breakfast.   finasteride 5 MG tablet Commonly known as:  PROSCAR Take 1 tablet by mouth daily.   lisinopril 5 MG tablet Commonly known as:  PRINIVIL,ZESTRIL Take 1 tablet (5 mg total) by mouth daily. Start taking on:  08/31/2016 What changed:  medication strength  how much to  take   metoprolol tartrate 25 MG tablet Commonly known as:  LOPRESSOR Take 1 tablet (25 mg total) by mouth 2 (two) times daily.   oxyCODONE-acetaminophen 5-325 MG tablet Commonly known as:  PERCOCET/ROXICET Take 1 tablet by mouth every 8 (eight) hours as needed for moderate pain or severe pain.   pantoprazole 40 MG tablet Commonly known as:  PROTONIX Take 1 tablet (40 mg total) by mouth daily. Start taking on:  08/31/2016   senna-docusate 8.6-50 MG tablet Commonly known as:  Senokot-S Take 1 tablet by mouth at bedtime.   tamsulosin 0.4 MG Caps capsule Commonly known as:  FLOMAX Take 2 capsules (0.8 mg total) by mouth daily after supper.      No Known Allergies Follow-up Information    [name], MD Follow up in 1 week(s).   Specialty:  Cardiology Why:  NSTEMI, s/p stenting Contact information: [address] [place] [address] [phone]        Waverly Ferrari, MD Follow up in 1 month(s).   Specialties:  Vascular Surgery, Cardiology Why:  left lower extremity dvt and superficial thrombophlebitis, repeat venous US per Dr Edilia Bo. Contact information: [address] [place] [address] [phone]            The results of significant diagnostics from this hospitalization (including imaging, microbiology, ancillary and laboratory) are listed below for reference.    Significant Diagnostic Studies: Ct Abdomen Pelvis Wo Contrast  Result Date: 08/19/2016 CLINICAL DATA:  Acute abdominal and pelvic pain for 1 day. EXAM: CT ABDOMEN AND PELVIS WITHOUT CONTRAST TECHNIQUE: Multidetector CT imaging of the abdomen and pelvis was performed following the standard protocol without IV contrast. COMPARISON:  None. FINDINGS: Please note that parenchymal abnormalities may be missed without intravenous contrast. Lower chest: Small bilateral pleural effusions, small pericardial effusion and mild bibasilar atelectasis noted. Cardiomegaly and coronary  artery calcifications identified. Hepatobiliary: Unremarkable. The patient is status post cholecystectomy. There is no evidence of biliary dilatation. Pancreas: Unremarkable. Spleen: Unremarkable Adrenals/Urinary Tract: Moderate to severe bilateral renal atrophy identified. There is no evidence of hydronephrosis, definite renal mass or urinary calculi. The adrenal glands and bladder are unremarkable. Stomach/Bowel: Colonic diverticulosis noted without evidence of diverticulitis. No bowel obstruction identified. The appendix is normal. Vascular/Lymphatic: Abdominal aortic atherosclerotic calcifications noted without aneurysm. No enlarged lymph nodes identified. Reproductive: Marked prostate enlargement noted. Other: No free fluid, pneumoperitoneum or focal collection. A small left paramedian supraumbilical hernia containing fat is noted. Musculoskeletal: Degenerative changes within the lumbar spine identified. No acute or suspicious abnormality noted. IMPRESSION: Small bilateral pleural effusions, small pericardial effusion and mild bibasilar atelectasis. No evidence  of acute abnormality within the abdomen or pelvis. Abdominal aortic atherosclerosis. Cardiomegaly and coronary disease. Renal atrophy and marked prostamegaly. Electronically Signed   By: Harmon Pier M.D.   On: 08/19/2016 14:47   Dg Chest 2 View  Result Date: 08/19/2016 CLINICAL DATA:  Chest pain.  Abdominal pain. EXAM: CHEST  2 VIEW COMPARISON:  None. FINDINGS: Top-normal heart size . Aortic atherosclerosis. Otherwise normal mediastinal contour. No pneumothorax. Small right pleural effusion. No left pleural effusion. No overt pulmonary edema. Hyperinflated lungs. Patchy opacity at the right lung base. Curvilinear opacities at the left lung base. IMPRESSION: 1. Patchy right lung mass opacity, which could represent atelectasis, aspiration or pneumonia. 2. Curvilinear left lung base opacity, favor scarring or atelectasis. 3. Hyperinflated lungs,  suggesting obstructive lung disease. 4. Aortic atherosclerosis. Electronically Signed   By: Delbert Phenix M.D.   On: 08/19/2016 12:35   Dg Tibia/fibula Left  Result Date: 08/27/2016 EXAM: LEFT TIBIA AND FIBULA - 2 VIEW COMPARISON:  None. FINDINGS: No fracture or bone lesion. Knee joint is normally aligned. There is moderate to marked lateral compartment narrowing with marginal osteophytes. Ankle joint is normally aligned. Bones are diffusely demineralized. There is diffuse nonspecific subcutaneous soft tissue edema. IMPRESSION: 1. No fracture or acute bony abnormality. 2. Degenerative changes of the knee.  Ankle joint normally aligned. Electronically Signed   By: Amie Portland M.D.   On: 08/27/2016 15:21   Dg Chest Port 1 View  Result Date: 08/25/2016 CLINICAL DATA:  Congestive heart failure EXAM: PORTABLE CHEST 1 VIEW COMPARISON:  08/24/2016 FINDINGS: Cardiomediastinal silhouette is stable. Mild thoracic dextroscoliosis. Persistent right basilar atelectasis or scarring. Old right lower rib fracture. No infiltrate or pulmonary edema. Left arm PICC line with tip in left innominate vein. No pneumothorax. IMPRESSION: Mild thoracic dextroscoliosis. Persistent right basilar atelectasis or scarring. Old right lower rib fracture. No infiltrate or pulmonary edema. Electronically Signed   By: Natasha Mead M.D.   On: 08/25/2016 09:18   Dg Chest Port 1 View  Result Date: 08/24/2016 CLINICAL DATA:  Acute myocardial infarction with acute systolic heart failure. EXAM: PORTABLE CHEST 1 VIEW COMPARISON:  08/23/2016 and 08/19/2016 FINDINGS: Intra-aortic balloon pump in place. Swan-Ganz catheter tip is in the right pulmonary artery. Pulmonary vascularity is normal. Minimal residual atelectasis at the right base. Left base has cleared. IMPRESSION: Improving aeration of the left lung base. Minimal right base atelectasis. Electronically Signed   By: Francene Boyers M.D.   On: 08/24/2016 07:41   Dg Chest Port 1 View  Result  Date: 08/23/2016 CLINICAL DATA:  On intra-aortic balloon pump assist EXAM: PORTABLE CHEST 1 VIEW COMPARISON:  08/19/2016 FINDINGS: IABP marker projected over the aortic arch. Swan-Ganz catheter inserted via inferior approach with tip over the right hilum. Shallow inspiration with elevation of the right hemidiaphragm. Atelectasis in the lung bases previous right lung opacity has improved since prior study suggesting that it probably represented pneumonia or atelectasis. Small right pleural effusion. Old right rib fracture. No pneumothorax. Aortic calcification. IMPRESSION: Shallow inspiration. Atelectasis in the lung bases. Improving infiltration or atelectasis in the right lung base since previous study. Small right pleural effusion. Appliances appear in satisfactory position. Electronically Signed   By: Burman Nieves M.D.   On: 08/23/2016 21:30    Microbiology: Recent Results (from the past 240 hour(s))  MRSA PCR Screening     Status: None   Collection Time: 08/23/16 10:31 PM  Result Value Ref Range Status   MRSA by PCR NEGATIVE NEGATIVE Final  Comment:        The GeneXpert MRSA Assay (FDA approved for NASAL specimens only), is one component of a comprehensive MRSA colonization surveillance program. It is not intended to diagnose MRSA infection nor to guide or monitor treatment for MRSA infections.      Labs: Basic Metabolic Panel:  Recent Labs Lab 08/26/16 0236 08/27/16 0253 08/28/16 0303 08/29/16 0244 08/30/16 0221  NA 134* 138 138 134* 134*  K 3.2* 4.2 3.9 4.1 4.0  CL 103 105 105 105 103  CO2 27 20* 25 22 24   GLUCOSE 133* 128* 146* 159* 150*  BUN 24* 27* 28* 26* 25*  CREATININE 1.73* 1.59* 1.51* 1.42* 1.52*  CALCIUM 7.8* 8.3* 8.4* 8.1* 8.2*  MG 1.8 1.8  --   --   --    Liver Function Tests: No results for input(s): AST, ALT, ALKPHOS, BILITOT, PROT, ALBUMIN in the last 168 hours. No results for input(s): LIPASE, AMYLASE in the last 168 hours. No results for  input(s): AMMONIA in the last 168 hours. CBC:  Recent Labs Lab 08/25/16 0214 08/26/16 0236 08/28/16 0303 08/29/16 0244 08/30/16 0221  WBC 8.5 7.6 8.1 8.5 9.0  HGB 8.7* 8.2* 8.3* 8.0* 7.8*  HCT 27.0* 25.0*  24.1* 25.6* 25.5* 24.3*  MCV 90.3 90.6 90.1 91.7 91.4  PLT 133* 140* 183 191 224   Cardiac Enzymes: No results for input(s): CKTOTAL, CKMB, CKMBINDEX, TROPONINI in the last 168 hours. BNP: BNP (last 3 results)  Recent Labs  08/19/16 1046  BNP 737.0*    ProBNP (last 3 results) No results for input(s): PROBNP in the last 8760 hours.  CBG:  Recent Labs Lab 08/23/16 2058 08/24/16 0005 08/24/16 0406  GLUCAP 108* 105* 94       Signed:  Zannie Locastro MD, PhD  [facility] 08/30/2016, 11:41 AM

## 2016-08-30 NOTE — Care Management Note (Signed)
Case Management Note Previous CM note initiated by Hanley Haysowell, Deborah T, RN 08/26/2016, 1:32 PM    Patient Details  Name: Tim GratesJohn A Guaman Jr. MRN: [id number] Date of Birth: 03-Dec-1928  Subjective/Objective:        Adm w ch pain            Action/Plan:lives w wife who is recovering from mi and followed [facility].   Expected Discharge Date:   08/30/16               Expected Discharge Plan:  Skilled Nursing Facility  In-House Referral:  Clinical Social Work  Discharge planning Services  CM Consult  Post Acute Care Choice:  Home Health Choice offered to:  Patient  DME Arranged:    DME Agency:     HH Arranged:  RN, PT HH Agency:  [facility]  Status of Service:  Completed, signed off  If discussed at Long Length of Stay Meetings, dates discussed:    Additional Comments:   08/30/16- 1100- Elizabethann Lackey Ferman HammingWebster rN, CM- pt for discharge today- plans are to [facility]/c to [facility]- [facility] following for [facility] needs [facility]  Dowell, Deborah T, [place] 08/26/2016, 1:32 PM spoke w pt reg dc needs and he feels he could benefit from [facility] for rn and phy there. They lives in [place] and wife uses [facility]. Tent ref to [employer] w [facility]. Will await final orders.  Zenda AlpersWebster, [place] Junction[place] Hall, RN 08/30/2016, 11:53 AM [phone]

## 2016-08-30 NOTE — Discharge Instructions (Signed)
Information on my medicine - ELIQUIS (apixaban)  This medication education was reviewed with me or my healthcare representative as part of my discharge preparation.    Why was Eliquis prescribed for you? Eliquis was prescribed to treat blood clots that may have been found in the veins of your legs (deep vein thrombosis) or in your lungs (pulmonary embolism) and to reduce the risk of them occurring again.  What do You need to know about Eliquis ? The starting dose is 10 mg (two 5 mg tablets) taken TWICE daily for the FIRST SEVEN (7) DAYS, then on (enter date)  09/05/16  the dose is reduced to ONE 5 mg tablet taken TWICE daily.  Eliquis may be taken with or without food.   Try to take the dose about the same time in the morning and in the evening. If you have difficulty swallowing the tablet whole please discuss with your pharmacist how to take the medication safely.  Take Eliquis exactly as prescribed and DO NOT stop taking Eliquis without talking to the doctor who prescribed the medication.  Stopping may increase your risk of developing a new blood clot.  Refill your prescription before you run out.  After discharge, you should have regular check-up appointments with your healthcare provider that is prescribing your Eliquis.    What do you do if you miss a dose? If a dose of ELIQUIS is not taken at the scheduled time, take it as soon as possible on the same day and twice-daily administration should be resumed. The dose should not be doubled to make up for a missed dose.  Important Safety Information A possible side effect of Eliquis is bleeding. You should call your healthcare provider right away if you experience any of the following: ? Bleeding from an injury or your nose that does not stop. ? Unusual colored urine (red or dark brown) or unusual colored stools (red or black). ? Unusual bruising for unknown reasons. ? A serious fall or if you hit your head (even if there is no  bleeding).  Some medicines may interact with Eliquis and might increase your risk of bleeding or clotting while on Eliquis. To help avoid this, consult your healthcare provider or pharmacist prior to using any new prescription or non-prescription medications, including herbals, vitamins, non-steroidal anti-inflammatory drugs (NSAIDs) and supplements.  This website has more information on Eliquis (apixaban): http://www.eliquis.com/eliquis/home

## 2016-08-30 NOTE — Progress Notes (Signed)
Started on Apixiban yesterday for DVT. Dosing per pharmacy. MUST stay on DAPT with aspirin and clopidogrel as well. Follow-up with Dr. Excell Seltzerooper or Dr. Tenny Crawoss after discharge. Plan for delayed PCI to the RCA, likely after 6 weeks of anticoagulation. No further suggestions at this time. Cardiology will sign-off. Call with questions.  Chrystie NoseKenneth C. Hilty, MD, [facility] HospitalFACC Attending Cardiologist [facility]

## 2016-08-31 ENCOUNTER — Telehealth: Payer: Self-pay | Admitting: Vascular Surgery

## 2016-08-31 NOTE — Telephone Encounter (Signed)
**  Tim Hopkins [phone]) [facility]**  spoke to pts family member today to advise of appt as well 08/31/16 beg

## 2016-08-31 NOTE — Telephone Encounter (Signed)
-----   Message from Sharee PimpleMarilyn K McChesney, RN sent at 08/29/2016 11:12 AM EDT ----- Regarding: 6 week appt with DVT    ----- Message ----- From: Chuck Hinthristopher S Dickson, MD Sent: 08/29/2016  10:42 AM To: [facility] Charge Pool Subject: charge and f/u                                 This patient can be in [  ]'s on the list. He will need a follow up duplex of his left lower extremity in 6 weeks with an office visit with me at that time. He has superficial thrombophlebitis of the left lesser saphenous vein and also calf vein DVT. Thank you.  CD

## 2016-09-06 ENCOUNTER — Emergency Department (HOSPITAL_COMMUNITY): Payer: Medicare Other

## 2016-09-06 ENCOUNTER — Encounter (HOSPITAL_COMMUNITY): Payer: Self-pay

## 2016-09-06 ENCOUNTER — Inpatient Hospital Stay (HOSPITAL_COMMUNITY)
Admission: EM | Admit: 2016-09-06 | Discharge: 2016-09-13 | DRG: 247 | Disposition: A | Discharge status: Skilled Nursing Facility | Payer: Medicare Other | Attending: Cardiovascular Disease | Admitting: Cardiovascular Disease

## 2016-09-06 DIAGNOSIS — I959 Hypotension, unspecified: Secondary | ICD-10-CM | POA: Diagnosis not present

## 2016-09-06 DIAGNOSIS — I447 Left bundle-branch block, unspecified: Secondary | ICD-10-CM | POA: Diagnosis present

## 2016-09-06 DIAGNOSIS — I255 Ischemic cardiomyopathy: Secondary | ICD-10-CM | POA: Diagnosis present

## 2016-09-06 DIAGNOSIS — Z955 Presence of coronary angioplasty implant and graft: Secondary | ICD-10-CM

## 2016-09-06 DIAGNOSIS — N183 Chronic kidney disease, stage 3 (moderate): Secondary | ICD-10-CM | POA: Diagnosis present

## 2016-09-06 DIAGNOSIS — I252 Old myocardial infarction: Secondary | ICD-10-CM | POA: Diagnosis not present

## 2016-09-06 DIAGNOSIS — I82402 Acute embolism and thrombosis of unspecified deep veins of left lower extremity: Secondary | ICD-10-CM | POA: Diagnosis present

## 2016-09-06 DIAGNOSIS — R262 Difficulty in walking, not elsewhere classified: Secondary | ICD-10-CM

## 2016-09-06 DIAGNOSIS — I259 Chronic ischemic heart disease, unspecified: Secondary | ICD-10-CM

## 2016-09-06 DIAGNOSIS — Z87891 Personal history of nicotine dependence: Secondary | ICD-10-CM | POA: Diagnosis not present

## 2016-09-06 DIAGNOSIS — K219 Gastro-esophageal reflux disease without esophagitis: Secondary | ICD-10-CM | POA: Diagnosis present

## 2016-09-06 DIAGNOSIS — Z7901 Long term (current) use of anticoagulants: Secondary | ICD-10-CM | POA: Diagnosis not present

## 2016-09-06 DIAGNOSIS — E7800 Pure hypercholesterolemia, unspecified: Secondary | ICD-10-CM | POA: Diagnosis present

## 2016-09-06 DIAGNOSIS — M79662 Pain in left lower leg: Secondary | ICD-10-CM

## 2016-09-06 DIAGNOSIS — Z79899 Other long term (current) drug therapy: Secondary | ICD-10-CM

## 2016-09-06 DIAGNOSIS — R001 Bradycardia, unspecified: Secondary | ICD-10-CM | POA: Diagnosis not present

## 2016-09-06 DIAGNOSIS — I5042 Chronic combined systolic (congestive) and diastolic (congestive) heart failure: Secondary | ICD-10-CM | POA: Diagnosis present

## 2016-09-06 DIAGNOSIS — E1122 Type 2 diabetes mellitus with diabetic chronic kidney disease: Secondary | ICD-10-CM | POA: Diagnosis present

## 2016-09-06 DIAGNOSIS — Z9842 Cataract extraction status, left eye: Secondary | ICD-10-CM | POA: Diagnosis not present

## 2016-09-06 DIAGNOSIS — Z9841 Cataract extraction status, right eye: Secondary | ICD-10-CM

## 2016-09-06 DIAGNOSIS — D649 Anemia, unspecified: Secondary | ICD-10-CM | POA: Diagnosis present

## 2016-09-06 DIAGNOSIS — I200 Unstable angina: Secondary | ICD-10-CM | POA: Diagnosis present

## 2016-09-06 DIAGNOSIS — I350 Nonrheumatic aortic (valve) stenosis: Secondary | ICD-10-CM | POA: Diagnosis present

## 2016-09-06 DIAGNOSIS — L8990 Pressure ulcer of unspecified site, unspecified stage: Secondary | ICD-10-CM | POA: Diagnosis present

## 2016-09-06 DIAGNOSIS — I209 Angina pectoris, unspecified: Secondary | ICD-10-CM | POA: Diagnosis present

## 2016-09-06 DIAGNOSIS — Z7902 Long term (current) use of antithrombotics/antiplatelets: Secondary | ICD-10-CM

## 2016-09-06 DIAGNOSIS — I110 Hypertensive heart disease with heart failure: Secondary | ICD-10-CM | POA: Diagnosis present

## 2016-09-06 DIAGNOSIS — I130 Hypertensive heart and chronic kidney disease with heart failure and stage 1 through stage 4 chronic kidney disease, or unspecified chronic kidney disease: Secondary | ICD-10-CM | POA: Diagnosis present

## 2016-09-06 DIAGNOSIS — I82409 Acute embolism and thrombosis of unspecified deep veins of unspecified lower extremity: Secondary | ICD-10-CM

## 2016-09-06 DIAGNOSIS — I420 Dilated cardiomyopathy: Secondary | ICD-10-CM | POA: Diagnosis present

## 2016-09-06 DIAGNOSIS — Z961 Presence of intraocular lens: Secondary | ICD-10-CM | POA: Diagnosis present

## 2016-09-06 DIAGNOSIS — I25110 Atherosclerotic heart disease of native coronary artery with unstable angina pectoris: Secondary | ICD-10-CM | POA: Diagnosis present

## 2016-09-06 DIAGNOSIS — M6281 Muscle weakness (generalized): Secondary | ICD-10-CM

## 2016-09-06 DIAGNOSIS — Z7982 Long term (current) use of aspirin: Secondary | ICD-10-CM

## 2016-09-06 HISTORY — DX: Acute embolism and thrombosis of unspecified deep veins of unspecified lower extremity: I82.409

## 2016-09-06 HISTORY — DX: Type 2 diabetes mellitus without complications: E11.9

## 2016-09-06 HISTORY — DX: Ischemic cardiomyopathy: I25.5

## 2016-09-06 HISTORY — DX: Nonrheumatic aortic (valve) stenosis: I35.0

## 2016-09-06 HISTORY — DX: Nonrheumatic mitral (valve) insufficiency: I34.0

## 2016-09-06 HISTORY — DX: Atherosclerotic heart disease of native coronary artery without angina pectoris: I25.10

## 2016-09-06 LAB — COMPREHENSIVE METABOLIC PANEL
ALBUMIN: 2.5 g/dL — AB (ref 3.5–5.0)
ALK PHOS: 39 U/L (ref 38–126)
ALT: 30 U/L (ref 17–63)
AST: 22 U/L (ref 15–41)
Anion gap: 6 (ref 5–15)
BILIRUBIN TOTAL: 0.9 mg/dL (ref 0.3–1.2)
BUN: 28 mg/dL — AB (ref 6–20)
CALCIUM: 8.1 mg/dL — AB (ref 8.9–10.3)
CO2: 25 mmol/L (ref 22–32)
Chloride: 109 mmol/L (ref 101–111)
Creatinine, Ser: 1.46 mg/dL — ABNORMAL HIGH (ref 0.61–1.24)
GFR calc Af Amer: 48 mL/min — ABNORMAL LOW (ref >60)
GFR, EST NON AFRICAN AMERICAN: 42 mL/min — AB (ref >60)
GLUCOSE: 163 mg/dL — AB (ref 65–99)
Potassium: 4.4 mmol/L (ref 3.5–5.1)
Sodium: 140 mmol/L (ref 135–145)
TOTAL PROTEIN: 5.6 g/dL — AB (ref 6.5–8.1)

## 2016-09-06 LAB — CBC WITH DIFFERENTIAL/PLATELET
BASOS ABS: 0.1 10*3/uL (ref 0.0–0.1)
BASOS PCT: 1 %
Eosinophils Absolute: 0.5 10*3/uL (ref 0.0–0.7)
Eosinophils Relative: 5 %
HEMATOCRIT: 26.4 % — AB (ref 39.0–52.0)
HEMOGLOBIN: 8.3 g/dL — AB (ref 13.0–17.0)
LYMPHS PCT: 12 %
Lymphs Abs: 1.2 10*3/uL (ref 0.7–4.0)
MCH: 28.8 pg (ref 26.0–34.0)
MCHC: 31.4 g/dL (ref 30.0–36.0)
MCV: 91.7 fL (ref 78.0–100.0)
MONOS PCT: 6 %
Monocytes Absolute: 0.6 10*3/uL (ref 0.1–1.0)
NEUTROS ABS: 7.8 10*3/uL — AB (ref 1.7–7.7)
NEUTROS PCT: 76 %
Platelets: 349 10*3/uL (ref 150–400)
RBC: 2.88 MIL/uL — ABNORMAL LOW (ref 4.22–5.81)
RDW: 13.9 % (ref 11.5–15.5)
WBC: 10.1 10*3/uL (ref 4.0–10.5)

## 2016-09-06 LAB — TROPONIN I
TROPONIN I: 0.04 ng/mL — AB (ref <0.03)
Troponin I: 0.03 ng/mL (ref <0.03)
Troponin I: 0.03 ng/mL (ref <0.03)

## 2016-09-06 LAB — APTT: APTT: 42 s — AB (ref 24–36)

## 2016-09-06 LAB — HEPARIN LEVEL (UNFRACTIONATED)

## 2016-09-06 MED ORDER — ACETAMINOPHEN 325 MG PO TABS
650.0000 mg | ORAL_TABLET | ORAL | Status: DC | PRN
Start: 2016-09-06 — End: 2016-09-13
  Administered 2016-09-08 – 2016-09-12 (×5): 650 mg via ORAL
  Filled 2016-09-06 (×5): qty 2

## 2016-09-06 MED ORDER — NITROGLYCERIN 0.4 MG SL SUBL: 0.4000 mg | SUBLINGUAL_TABLET | SUBLINGUAL | Status: DC | PRN

## 2016-09-06 MED ORDER — FERROUS SULFATE 325 (65 FE) MG PO TABS
325.0000 mg | ORAL_TABLET | Freq: Every day | ORAL | Status: DC
  Administered 2016-09-07 – 2016-09-09 (×2): 325 mg via ORAL
  Filled 2016-09-06 (×2): qty 1

## 2016-09-06 MED ORDER — ACETAMINOPHEN 500 MG PO TABS: 500.0000 mg | ORAL_TABLET | Freq: Four times a day (QID) | ORAL | 0 refills | Status: DC | PRN

## 2016-09-06 MED ORDER — ONDANSETRON HCL 4 MG/2ML IJ SOLN: 4.0000 mg | Freq: Four times a day (QID) | INTRAMUSCULAR | Status: DC | PRN

## 2016-09-06 MED ORDER — ASPIRIN EC 81 MG PO TBEC
81.0000 mg | DELAYED_RELEASE_TABLET | Freq: Every day | ORAL | Status: DC
  Administered 2016-09-07 – 2016-09-13 (×7): 81 mg via ORAL
  Filled 2016-09-06 (×7): qty 1

## 2016-09-06 MED ORDER — ATORVASTATIN CALCIUM 40 MG PO TABS
40.0000 mg | ORAL_TABLET | Freq: Every day | ORAL | Status: DC
  Administered 2016-09-06 – 2016-09-12 (×7): 40 mg via ORAL
  Filled 2016-09-06 (×8): qty 1

## 2016-09-06 MED ORDER — CLOPIDOGREL BISULFATE 75 MG PO TABS
75.0000 mg | ORAL_TABLET | Freq: Every day | ORAL | Status: DC
Start: 2016-09-07 — End: 2016-09-13
  Administered 2016-09-07 – 2016-09-13 (×7): 75 mg via ORAL
  Filled 2016-09-06 (×7): qty 1

## 2016-09-06 MED ORDER — TAMSULOSIN HCL 0.4 MG PO CAPS
0.8000 mg | ORAL_CAPSULE | Freq: Every day | ORAL | Status: DC
Start: 2016-09-06 — End: 2016-09-13
  Administered 2016-09-06 – 2016-09-12 (×7): 0.8 mg via ORAL
  Filled 2016-09-06 (×7): qty 2

## 2016-09-06 MED ORDER — FINASTERIDE 5 MG PO TABS
5.0000 mg | ORAL_TABLET | Freq: Every day | ORAL | Status: DC
  Administered 2016-09-06 – 2016-09-13 (×7): 5 mg via ORAL
  Filled 2016-09-06 (×7): qty 1

## 2016-09-06 MED ORDER — METOPROLOL TARTRATE 25 MG PO TABS
25.0000 mg | ORAL_TABLET | Freq: Two times a day (BID) | ORAL | Status: DC
  Administered 2016-09-06 – 2016-09-13 (×13): 25 mg via ORAL
  Filled 2016-09-06 (×14): qty 1

## 2016-09-06 MED ORDER — LISINOPRIL 5 MG PO TABS
5.0000 mg | ORAL_TABLET | Freq: Every day | ORAL | Status: DC
  Administered 2016-09-08: 18:00:00 5 mg via ORAL
  Filled 2016-09-06 (×2): qty 1

## 2016-09-06 MED ORDER — SENNOSIDES-DOCUSATE SODIUM 8.6-50 MG PO TABS
1.0000 | ORAL_TABLET | Freq: Every day | ORAL | Status: DC
  Administered 2016-09-06 – 2016-09-12 (×7): 1 via ORAL
  Filled 2016-09-06 (×7): qty 1

## 2016-09-06 MED ORDER — PANTOPRAZOLE SODIUM 40 MG PO TBEC
40.0000 mg | DELAYED_RELEASE_TABLET | Freq: Every day | ORAL | Status: DC
  Administered 2016-09-07 – 2016-09-13 (×6): 40 mg via ORAL
  Filled 2016-09-06 (×6): qty 1

## 2016-09-06 MED ORDER — HEPARIN (PORCINE) IN NACL 100-0.45 UNIT/ML-% IJ SOLN
1300.0000 [IU]/h | INTRAMUSCULAR | Status: DC
Start: 2016-09-07 — End: 2016-09-07

## 2016-09-06 NOTE — ED Notes (Signed)
CRITICAL VALUE ALERT  Critical value received:  Troponin 0.03  Date of notification:  09/06/2016  Time of notification:  1145  Critical value read back:yes  Nurse who received alert:  Fredirick MaudlinHolley Kaycee Mcgaugh RN   PA notified: Keenan BachelorSofia

## 2016-09-06 NOTE — Progress Notes (Signed)
Pt arrived from ED via stretcher. Vital signs stable, pt on telemetry, CCMD notified. Pt oriented to room and staff. Bed in lowest position, call light within reach. Will continue to monitor.
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
## 2016-09-06 NOTE — ED Triage Notes (Signed)
BIB [facility] from [facility] [place]. Staff reports to EMS that pt. Complaining of chest pain on right and left starting at 7 am. EMS reports that the facility gave Asprin, tylenol, and 3 Nitro. Pt. Denied relief with Nitro. Pt. C/o dehydration to EMS. Pt. Denies Nitro from EMS stating that "pain was decreased". VS - 65 HR, 114/60 BP. Pt. Denies NVD, denies dizziness, denies trouble breathing.

## 2016-09-06 NOTE — ED Notes (Signed)
MD at bedside. 

## 2016-09-06 NOTE — Progress Notes (Signed)
ANTICOAGULATION CONSULT NOTE - Initial Consult  Pharmacy Consult for heparin dosing for  Indication: chest pain/ACS  No Known Allergies  Patient Measurements: Height: 6\' 1"  (185.4 cm) Weight: 228 lb (103.4 kg) IBW/kg (Calculated) : 79.9 Heparin Dosing Weight: 100.9  Vital Signs: BP: 118/72 (10/03 1730) Pulse Rate: 67 (10/03 1730)  Labs:  Recent Labs  09/06/16 1052 09/06/16 1535  HGB 8.3*  --   HCT 26.4*  --   PLT 349  --   CREATININE 1.46*  --   TROPONINI 0.03* 0.03*    Estimated Creatinine Clearance: 45.9 mL/min (by C-G formula based on SCr of 1.46 mg/dL (H)).   Medical History: Past Medical History:  Diagnosis Date  . Arthritis   . Diabetes mellitus without complication (HCC)   . GERD (gastroesophageal reflux disease)    depending on diet  . Hypertension     Medications:  Eliquis PTA  Assessment: Pt is a [age] YOM seen in ED for chest pain. Pt reported substernal chest pain this AM at SNF, took one dose SL NTG w/o relief, had moderate relief after 3rd dose of SL NTG. Pt had coronary stent placement on 08/24/16 and has severe RCA stenosis with plans for RAC staged intervention. This is first reported episode of angina since discharge on 08/30/16.   On 10/3 H/H is 8.3/26.4 (Baseline Hgb appears to be ~8), Plt 349. No s/sx of bleeding noted.  Goal of Therapy:  Heparin level 0.3-0.7 units/ml Monitor platelets by anticoagulation protocol: Yes   Plan:  Obtain baseline heparin levels and aPTT Start heparin infusion at 1300 units/hr beginning @ 0500 on 10/4 Obtain heparin level and aPTT at 1300 on 10/4 Obtain daily heparin levels and aPTT until correlation Obtain daily CBCs Follow for s/sx of bleeding  Eino FarberJustin R Sanay Belmar 09/06/2016,6:23 PM

## 2016-09-07 DIAGNOSIS — L8990 Pressure ulcer of unspecified site, unspecified stage: Secondary | ICD-10-CM | POA: Insufficient documentation

## 2016-09-07 LAB — BASIC METABOLIC PANEL
Anion gap: 5 (ref 5–15)
BUN: 26 mg/dL — AB (ref 6–20)
CALCIUM: 8.2 mg/dL — AB (ref 8.9–10.3)
CO2: 26 mmol/L (ref 22–32)
CREATININE: 1.39 mg/dL — AB (ref 0.61–1.24)
Chloride: 107 mmol/L (ref 101–111)
GFR calc Af Amer: 51 mL/min — ABNORMAL LOW (ref >60)
GFR calc non Af Amer: 44 mL/min — ABNORMAL LOW (ref >60)
GLUCOSE: 150 mg/dL — AB (ref 65–99)
Potassium: 4 mmol/L (ref 3.5–5.1)
Sodium: 138 mmol/L (ref 135–145)

## 2016-09-07 LAB — CBC
HCT: 26.5 % — ABNORMAL LOW (ref 39.0–52.0)
Hemoglobin: 8.1 g/dL — ABNORMAL LOW (ref 13.0–17.0)
MCH: 28.2 pg (ref 26.0–34.0)
MCHC: 30.6 g/dL (ref 30.0–36.0)
MCV: 92.3 fL (ref 78.0–100.0)
PLATELETS: 346 10*3/uL (ref 150–400)
RBC: 2.87 MIL/uL — ABNORMAL LOW (ref 4.22–5.81)
RDW: 13.9 % (ref 11.5–15.5)
WBC: 9.6 10*3/uL (ref 4.0–10.5)

## 2016-09-07 LAB — APTT
APTT: 87 s — AB (ref 24–36)
aPTT: >200 seconds (ref 24–36)

## 2016-09-07 LAB — HEPARIN LEVEL (UNFRACTIONATED): Heparin Unfractionated: >2.2 IU/mL — ABNORMAL HIGH (ref 0.30–0.70)

## 2016-09-07 LAB — PROTIME-INR
INR: 1.8
PROTHROMBIN TIME: 21.1 s — AB (ref 11.4–15.2)

## 2016-09-07 LAB — TROPONIN I: TROPONIN I: 0.04 ng/mL — AB (ref <0.03)

## 2016-09-07 MED ORDER — HEPARIN (PORCINE) IN NACL 100-0.45 UNIT/ML-% IJ SOLN
900.0000 [IU]/h | INTRAMUSCULAR | Status: DC
  Administered 2016-09-07 – 2016-09-08 (×2): 900 [IU]/h via INTRAVENOUS
  Filled 2016-09-07: qty 250

## 2016-09-07 MED ORDER — HEPARIN (PORCINE) IN NACL 100-0.45 UNIT/ML-% IJ SOLN
1300.0000 [IU]/h | INTRAMUSCULAR | Status: DC
  Administered 2016-09-07: 1300 [IU]/h via INTRAVENOUS
  Filled 2016-09-07: qty 250

## 2016-09-07 NOTE — Care Management Note (Signed)
Case Management Note Donn PieriniKristi Carnetta Losada RN, BSN Unit 2W-Case Manager [phone]  Patient Details  Name: Tim GratesJohn A Oliveria Jr. MRN: [id number] Date of Birth: [birth year]  Subjective/Objective:   [place] with [place]                 Action/Plan: PTA pt was at [facility] [place] for [facility] rehab- recent discharge- CSW following for return to SNF - will need updated PT eval for return   Expected Discharge Date:                  Expected Discharge Plan:  Skilled Nursing Facility  In-House Referral:  Clinical Social Work  Discharge planning Services  CM Consult  Post Acute Care Choice:    Choice offered to:     DME Arranged:    DME Agency:     HH Arranged:    HH Agency:     Status of Service:  In process, will continue to follow  If discussed at Long Length of Stay Meetings, dates discussed:    Additional Comments:  Darrold SpanWebster, Andrew Blasius Hall, RN 09/07/2016, 2:32 PM

## 2016-09-07 NOTE — NC FL2 (Signed)
[place] MEDICAID FL2 LEVEL OF CARE SCREENING TOOL     IDENTIFICATION  Patient Name: Tim Hopkins. Birthdate: Dec 28, 1927 Sex: male Admission Date (Current Location): 09/06/2016  [facility] and [place] Number:  [employer] and Address:  [facility], [address] [place], [place] [address]      Provider Number: [id number]  Attending Physician Name and Address:  [name], MD  Relative Name and Phone Number:       Current Level of Care: Hospital Recommended Level of Care: Skilled Nursing Facility Prior Approval Number:    Date Approved/Denied:   PASRR Number:   [id number]   Discharge Plan: SNF    Current Diagnoses: Patient Active Problem List   Diagnosis Date Noted  . Unstable angina (HCC) 09/06/2016  . Leg pain, left   . Anemia 08/27/2016  . Hypokalemia   . Cardiomyopathy, ischemic   . Left main coronary artery disease   . Acute systolic heart failure (HCC)   . Renal insufficiency   . NSTEMI (non-ST elevated myocardial infarction) (HCC) 08/19/2016  . Diabetes mellitus without complication (HCC)   . Essential hypertension   . Arthritis   . Bilateral pleural effusion     Orientation RESPIRATION BLADDER Height & Weight     Self, Time, Situation, Place  Normal External catheter (Placement date: 08/23/16) Weight: 228 lb (103.4 kg) Height:  6\' 1"  (185.4 cm)  BEHAVIORAL SYMPTOMS/MOOD NEUROLOGICAL BOWEL NUTRITION STATUS   (None)   Continent Diet (Heart health: thin liquids)  AMBULATORY STATUS COMMUNICATION OF NEEDS Skin   Limited Assist Verbally PU Stage and Appropriate Care PU Stage 1 Dressing: No Dressing                     Personal Care Assistance Level of Assistance    Bathing Assistance: Limited assistance Feeding assistance: Independent Dressing Assistance: Limited assistance     Functional Limitations Info    Sight Info: Adequate Hearing Info: Impaired Speech Info: Adequate    SPECIAL CARE FACTORS  FREQUENCY  PT (By licensed PT), OT (By licensed OT)     PT Frequency: 5x week OT Frequency: 5x week            Contractures Contractures Info: Not present    Additional Factors Info  Code Status Code Status Info: Full Allergies Info: No know allergies            Current Medications (09/07/2016):  This is the current hospital active medication list Current Facility-Administered Medications  Medication Dose Route Frequency Provider Last Rate Last Dose  . acetaminophen (TYLENOL) tablet 650 mg  650 mg Oral Q4H PRN Little Ishikawa, NP      . aspirin EC tablet 81 mg  81 mg Oral Daily Little Ishikawa, NP      . atorvastatin (LIPITOR) tablet 40 mg  40 mg Oral q1800 Little Ishikawa, NP   40 mg at 09/06/16 2108  . clopidogrel (PLAVIX) tablet 75 mg  75 mg Oral Q breakfast Little Ishikawa, NP   75 mg at 09/07/16 0600  . ferrous sulfate tablet 325 mg  325 mg Oral Q breakfast Little Ishikawa, NP   325 mg at 09/07/16 0600  . finasteride (PROSCAR) tablet 5 mg  5 mg Oral Daily Little Ishikawa, NP   5 mg at 09/06/16 2102  . heparin ADULT infusion 100 units/mL (25000 units/257mL sodium chloride 0.45%)  1,300 Units/hr Intravenous Continuous [name], MD [phone]  mL/hr at 09/07/16 0054 1,300 Units/hr at 09/07/16 0054  . lisinopril (PRINIVIL,ZESTRIL) tablet 5 mg  5 mg Oral Daily Little IshikawaErin E Smith, NP      . metoprolol tartrate (LOPRESSOR) tablet 25 mg  25 mg Oral BID Little IshikawaErin E Smith, NP   25 mg at 09/06/16 2102  . nitroGLYCERIN (NITROSTAT) SL tablet 0.4 mg  0.4 mg Sublingual Q5 Min x 3 PRN Little IshikawaErin E Smith, NP      . ondansetron (ZOFRAN) injection 4 mg  4 mg Intravenous Q6H PRN Little IshikawaErin E Smith, NP      . pantoprazole (PROTONIX) EC tablet 40 mg  40 mg Oral Daily Little IshikawaErin E Smith, NP      . senna-docusate (Senokot-S) tablet 1 tablet  1 tablet Oral QHS Little IshikawaErin E Smith, NP   1 tablet at 09/06/16 2102  . tamsulosin (FLOMAX) capsule 0.8 mg  0.8 mg Oral QPC supper Little IshikawaErin E Smith, NP   0.8 mg at 09/06/16 2101     Discharge Medications: Please  see discharge summary for a list of discharge medications.  Relevant Imaging Results:  Relevant Lab Results:   Additional Information SSN: [id number]  Volney AmericanBridget A Mayton, LCSW

## 2016-09-07 NOTE — Progress Notes (Signed)
ANTICOAGULATION CONSULT NOTE - Follow Up Consult  Pharmacy Consult for heparin Indication: DVT and ACS  Labs:  Recent Labs  09/06/16 1052 09/06/16 1535 09/06/16 2118 09/07/16 0308 09/07/16 0953 09/07/16 2226  HGB 8.3*  --   --  8.1*  --   --   HCT 26.4*  --   --  26.5*  --   --   PLT 349  --   --  346  --   --   APTT  --   --  42*  --  >200* 87*  LABPROT  --   --   --  21.1*  --   --   INR  --   --   --  1.80  --   --   HEPARINUNFRC  --   --  >2.20*  --  >2.20*  --   CREATININE 1.46*  --   --  1.39*  --   --   TROPONINI 0.03* 0.03* 0.04* 0.04*  --   --     Assessment/Plan:  82yo male therapeutic on heparin after rate changes. Will continue gtt at current rate and confirm stable with am labs.   Vernard GamblesVeronda Geremiah Fussell, PharmD, BCPS  09/07/2016,11:37 PM

## 2016-09-07 NOTE — Plan of Care (Signed)
Problem: Education: Goal: Knowledge of [facility] Education information/materials will improve Outcome: Progressing POC reviewed with pt.   

## 2016-09-07 NOTE — Progress Notes (Signed)
    Subjective:  No CP or dyspnea.  Objective:  Vital Signs in the last 24 hours: Temp:  [97.4 F (36.3 C)-98.4 F (36.9 C)] 97.4 F (36.3 C) (10/04 0605) Pulse Rate:  [62-78] 71 (10/04 0605) Resp:  [16-25] 20 (10/04 0605) BP: (97-136)/(37-72) 128/55 (10/04 0605) SpO2:  [95 %-99 %] 96 % (10/04 0605)  Intake/Output from previous day: 10/03 0701 - 10/04 0700 In: -  Out: 850 [Urine:850]  Physical Exam: Pt is alert and oriented, NAD HEENT: normal Neck: JVP - normal, carotids 2+= without bruits Lungs: CTA bilaterally CV: RRR with 2/6 SEM at the RUSB Abd: soft, NT, Positive BS, no hepatomegaly Ext: no C/C/E on the right. The left leg is tender to palpation over the calf with 1+ edema, distal pulses intact and equal Skin: warm/dry no rash  Lab Results:  Recent Labs  09/06/16 1052 09/07/16 0308  WBC 10.1 9.6  HGB 8.3* 8.1*  PLT 349 346    Recent Labs  09/06/16 1052 09/07/16 0308  NA 140 138  K 4.4 4.0  CL 109 107  CO2 25 26  GLUCOSE 163* 150*  BUN 28* 26*  CREATININE 1.46* 1.39*    Recent Labs  09/06/16 2118 09/07/16 0308  TROPONINI 0.04* 0.04*    Tele: Sinus rhythm  Assessment/Plan:  1. Unstable angina: troponin minimally elevated. No recurrent chest pain. Patient is continued on aspirin and Plavix. He is on IV heparin now off of Eliquis since yesterday. Plans for cardiac catheterization/PCI of the right coronary artery tomorrow as previously outlined. BP too low to escalate medical therapy.  2. Ischemic cardiomyopathy/chronic systolic heart failure: Appears compensated. Blood pressure is low and unable to further titrate medications. Repeat 2-D echocardiogram.  3. Anemia: Hemoglobin is stable. Continue iron  4. Left lower extremity below-knee DVT: Currently on heparin, will resume Xarelto post PCI with plans for a total of 6 weeks of anticoagulation. The patient is scheduled to have a follow-up venous duplex at Dr. Adele Danickson's office.  5. Aortic  stenosis: reassess with repeat echo study.  Tim Hopkins, Tim Hopkins, M.D. 09/07/2016, 10:39 AM

## 2016-09-07 NOTE — Progress Notes (Signed)
ANTICOAGULATION CONSULT NOTE   Pharmacy Consult for heparin Indication: chest pain/ACS  Assessment: Pt is a [age] YOM seen in ED for chest pain. Pt reported substernal chest pain 10/3 at SNF, took one dose SL NTG w/o relief, had moderate relief after 3rd dose of SL NTG. Pt had coronary stent placement on 08/24/16 and has severe RCA stenosis with plans for RAC staged intervention. This is first reported episode of angina since discharge on 08/30/16.   Heparin level 2.2 due to recent apixaban use, aptt this afternoon was also reported to be >200. Instructed nurse to hold infusion for one hour then restart at 900 units/hr. No bleeding issues noted. Hgb low at 8.1 but stable from yesterday.  Goal of Therapy:  aptt goal 66-102 Heparin level 0.3-0.7 units/ml Monitor platelets by anticoagulation protocol: Yes   Plan:  Hold heparin for one hour Restart infusion at 900 units/hr Check aptt in 8 hours then daily with heparin level  No Known Allergies  Patient Measurements: Height: 6\' 1"  (185.4 cm) Weight: 228 lb (103.4 kg) IBW/kg (Calculated) : 79.9 Heparin Dosing Weight: 100.9  Vital Signs: Temp: 97.4 F (36.3 C) (10/04 0605) Temp Source: Oral (10/04 0605) BP: 98/53 (10/04 1124) Pulse Rate: 71 (10/04 0605)  Labs:  Recent Labs  09/06/16 1052 09/06/16 1535 09/06/16 2118 09/07/16 0308 09/07/16 0953  HGB 8.3*  --   --  8.1*  --   HCT 26.4*  --   --  26.5*  --   PLT 349  --   --  346  --   APTT  --   --  42*  --  >200*  LABPROT  --   --   --  21.1*  --   INR  --   --   --  1.80  --   HEPARINUNFRC  --   --  >2.20*  --  >2.20*  CREATININE 1.46*  --   --  1.39*  --   TROPONINI 0.03* 0.03* 0.04* 0.04*  --     Estimated Creatinine Clearance: 48.2 mL/min (by C-G formula based on SCr of 1.39 mg/dL (H)).   Medical History: Past Medical History:  Diagnosis Date  . Arthritis    "knees" (09/06/2016)  . CHF (congestive heart failure) (HCC)   . Coronary artery disease   . DVT (deep  venous thrombosis) (HCC) 09/06/2016   LLE  . GERD (gastroesophageal reflux disease)    depending on diet  . High cholesterol   . Hypertension   . Type II diabetes mellitus (HCC)     Medications:  Eliquis PTA   Sheppard CoilFrank Wilson PharmD., BCPS Clinical Pharmacist Pager [phone] 09/07/2016 11:59 AM

## 2016-09-08 ENCOUNTER — Encounter (HOSPITAL_COMMUNITY)
Admission: EM | Disposition: A | Discharge status: Skilled Nursing Facility | Payer: Self-pay | Source: Home / Self Care | Attending: Cardiovascular Disease

## 2016-09-08 ENCOUNTER — Other Ambulatory Visit: Payer: Self-pay

## 2016-09-08 ENCOUNTER — Other Ambulatory Visit (HOSPITAL_COMMUNITY): Payer: Medicare Other

## 2016-09-08 DIAGNOSIS — D649 Anemia, unspecified: Secondary | ICD-10-CM

## 2016-09-08 DIAGNOSIS — I25110 Atherosclerotic heart disease of native coronary artery with unstable angina pectoris: Principal | ICD-10-CM

## 2016-09-08 HISTORY — PX: CARDIAC CATHETERIZATION: SHX172

## 2016-09-08 LAB — CBC
HCT: 23.5 % — ABNORMAL LOW (ref 39.0–52.0)
Hemoglobin: 7.3 g/dL — ABNORMAL LOW (ref 13.0–17.0)
MCH: 28.6 pg (ref 26.0–34.0)
MCHC: 31.1 g/dL (ref 30.0–36.0)
MCV: 92.2 fL (ref 78.0–100.0)
PLATELETS: 352 10*3/uL (ref 150–400)
RBC: 2.55 MIL/uL — AB (ref 4.22–5.81)
RDW: 14.2 % (ref 11.5–15.5)
WBC: 7.8 10*3/uL (ref 4.0–10.5)

## 2016-09-08 LAB — PREPARE RBC (CROSSMATCH)

## 2016-09-08 LAB — POCT I-STAT, CHEM 8
BUN: 23 mg/dL — ABNORMAL HIGH (ref 6–20)
CHLORIDE: 105 mmol/L (ref 101–111)
Calcium, Ion: 1.18 mmol/L (ref 1.15–1.40)
Creatinine, Ser: 1.2 mg/dL (ref 0.61–1.24)
GLUCOSE: 126 mg/dL — AB (ref 65–99)
HEMATOCRIT: 27 % — AB (ref 39.0–52.0)
HEMOGLOBIN: 9.2 g/dL — AB (ref 13.0–17.0)
POTASSIUM: 4 mmol/L (ref 3.5–5.1)
Sodium: 141 mmol/L (ref 135–145)
TCO2: 25 mmol/L (ref 0–100)

## 2016-09-08 LAB — HEPARIN LEVEL (UNFRACTIONATED): Heparin Unfractionated: >2.2 IU/mL — ABNORMAL HIGH (ref 0.30–0.70)

## 2016-09-08 LAB — POCT ACTIVATED CLOTTING TIME
ACTIVATED CLOTTING TIME: 296 s
Activated Clotting Time: 147 seconds

## 2016-09-08 LAB — APTT: APTT: 94 s — AB (ref 24–36)

## 2016-09-08 LAB — GLUCOSE, CAPILLARY
GLUCOSE-CAPILLARY: 114 mg/dL — AB (ref 65–99)
Glucose-Capillary: 171 mg/dL — ABNORMAL HIGH (ref 65–99)

## 2016-09-08 SURGERY — CORONARY STENT INTERVENTION

## 2016-09-08 MED ORDER — VERAPAMIL HCL 2.5 MG/ML IV SOLN
INTRAVENOUS | Status: DC | PRN
  Administered 2016-09-08: 10 mL via INTRA_ARTERIAL

## 2016-09-08 MED ORDER — VERAPAMIL HCL 2.5 MG/ML IV SOLN
INTRAVENOUS | Status: AC
  Filled 2016-09-08: qty 2

## 2016-09-08 MED ORDER — IOPAMIDOL (ISOVUE-370) INJECTION 76%
INTRAVENOUS | Status: AC
  Filled 2016-09-08: qty 125

## 2016-09-08 MED ORDER — FENTANYL CITRATE (PF) 100 MCG/2ML IJ SOLN
INTRAMUSCULAR | Status: AC
  Filled 2016-09-08: qty 2

## 2016-09-08 MED ORDER — SODIUM CHLORIDE 0.9 % IV SOLN
Freq: Once | INTRAVENOUS | Status: AC
  Administered 2016-09-08: 07:00:00 via INTRAVENOUS

## 2016-09-08 MED ORDER — HEPARIN (PORCINE) IN NACL 2-0.9 UNIT/ML-% IJ SOLN
INTRAMUSCULAR | Status: DC | PRN
  Administered 2016-09-08: 1500 mL

## 2016-09-08 MED ORDER — HEPARIN (PORCINE) IN NACL 2-0.9 UNIT/ML-% IJ SOLN: INTRAMUSCULAR | Status: DC | PRN

## 2016-09-08 MED ORDER — NITROGLYCERIN 1 MG/10 ML FOR IR/CATH LAB
INTRA_ARTERIAL | Status: AC
  Filled 2016-09-08: qty 10

## 2016-09-08 MED ORDER — FENTANYL CITRATE (PF) 100 MCG/2ML IJ SOLN
INTRAMUSCULAR | Status: DC | PRN
  Administered 2016-09-08 (×2): 25 ug via INTRAVENOUS

## 2016-09-08 MED ORDER — SODIUM CHLORIDE 0.9% FLUSH
3.0000 mL | Freq: Two times a day (BID) | INTRAVENOUS | Status: DC
  Administered 2016-09-08 – 2016-09-13 (×8): 3 mL via INTRAVENOUS

## 2016-09-08 MED ORDER — LIDOCAINE HCL (PF) 1 % IJ SOLN
INTRAMUSCULAR | Status: AC
  Filled 2016-09-08: qty 30

## 2016-09-08 MED ORDER — SODIUM CHLORIDE 0.9% FLUSH: 3.0000 mL | INTRAVENOUS | Status: DC | PRN

## 2016-09-08 MED ORDER — HEPARIN SODIUM (PORCINE) 1000 UNIT/ML IJ SOLN
INTRAMUSCULAR | Status: AC
  Filled 2016-09-08: qty 1

## 2016-09-08 MED ORDER — HEPARIN SODIUM (PORCINE) 1000 UNIT/ML IJ SOLN
INTRAMUSCULAR | Status: DC | PRN
  Administered 2016-09-08: 7000 [IU] via INTRAVENOUS

## 2016-09-08 MED ORDER — APIXABAN 5 MG PO TABS: 5.0000 mg | ORAL_TABLET | Freq: Two times a day (BID) | ORAL | Status: DC

## 2016-09-08 MED ORDER — SODIUM CHLORIDE 0.9 % IV SOLN
INTRAVENOUS | Status: DC | PRN
  Administered 2016-09-08: 10 mL/h via INTRAVENOUS

## 2016-09-08 MED ORDER — MIDAZOLAM HCL 2 MG/2ML IJ SOLN
INTRAMUSCULAR | Status: DC | PRN
  Administered 2016-09-08: 1 mg via INTRAVENOUS

## 2016-09-08 MED ORDER — SODIUM CHLORIDE 0.9 % IV SOLN
INTRAVENOUS | Status: AC
  Administered 2016-09-08: 18:00:00 via INTRAVENOUS

## 2016-09-08 MED ORDER — MIDAZOLAM HCL 2 MG/2ML IJ SOLN
INTRAMUSCULAR | Status: AC
  Filled 2016-09-08: qty 2

## 2016-09-08 MED ORDER — SODIUM CHLORIDE 0.9 % IV SOLN: 250.0000 mL | INTRAVENOUS | Status: DC | PRN

## 2016-09-08 MED ORDER — IOPAMIDOL (ISOVUE-370) INJECTION 76%
INTRAVENOUS | Status: DC | PRN
  Administered 2016-09-08: 165 mL via INTRA_ARTERIAL

## 2016-09-08 MED ORDER — APIXABAN 5 MG PO TABS
5.0000 mg | ORAL_TABLET | Freq: Two times a day (BID) | ORAL | Status: DC
  Administered 2016-09-08 – 2016-09-13 (×10): 5 mg via ORAL
  Filled 2016-09-08 (×10): qty 1

## 2016-09-08 MED ORDER — LIDOCAINE HCL (PF) 1 % IJ SOLN
INTRAMUSCULAR | Status: DC | PRN
  Administered 2016-09-08: 2 mL

## 2016-09-08 MED ORDER — HEPARIN (PORCINE) IN NACL 2-0.9 UNIT/ML-% IJ SOLN
INTRAMUSCULAR | Status: AC
  Filled 2016-09-08: qty 1000

## 2016-09-08 SURGICAL SUPPLY — 17 items
BALLN EMERGE MR 2.5X12 (BALLOONS) ×3
BALLN ~~LOC~~ EUPHORA RX 3.5X8 (BALLOONS) ×3
BALLOON EMERGE MR 2.5X12 (BALLOONS) ×1 IMPLANT
BALLOON ~~LOC~~ EUPHORA RX 3.5X8 (BALLOONS) ×1 IMPLANT
CATH 5FR JL4 DIAGNOSTIC (CATHETERS) ×3 IMPLANT
CATH VISTA GUIDE 6FR JR4 (CATHETERS) ×3 IMPLANT
DEVICE RAD COMP TR BAND LRG (VASCULAR PRODUCTS) ×3 IMPLANT
GLIDESHEATH SLEND A-KIT 6F 22G (SHEATH) ×3 IMPLANT
KIT ENCORE 26 ADVANTAGE (KITS) ×6 IMPLANT
KIT HEART LEFT (KITS) ×3 IMPLANT
PACK CARDIAC CATHETERIZATION (CUSTOM PROCEDURE TRAY) ×3 IMPLANT
STENT PROMUS PREM MR 3.0X12 (Permanent Stent) ×3 IMPLANT
STENT PROMUS PREM MR 3.0X16 (Permanent Stent) ×3 IMPLANT
TRANSDUCER W/STOPCOCK (MISCELLANEOUS) ×3 IMPLANT
TUBING CIL FLEX 10 FLL-RA (TUBING) ×3 IMPLANT
WIRE HI TORQ BMW 190CM (WIRE) ×3 IMPLANT
WIRE SAFE-T 1.5MM-J .035X260CM (WIRE) ×3 IMPLANT

## 2016-09-08 NOTE — H&P (View-Only) (Signed)
    Subjective:  No chest pain or shortness of breath. No complaints this morning.  Objective:  Vital Signs in the last 24 hours: Temp:  [98.4 F (36.9 C)-98.6 F (37 C)] 98.6 F (37 C) (10/05 0419) Pulse Rate:  [71-82] 71 (10/05 0419) Resp:  [18-20] 20 (10/05 0419) BP: (98-119)/(44-87) 116/44 (10/05 0419) SpO2:  [95 %-100 %] 98 % (10/05 0419)  Intake/Output from previous day: 10/04 0701 - 10/05 0700 In: 588 [P.O.:480; I.V.:108] Out: 700 [Urine:700]  Physical Exam: Pt is alert and oriented, pleasant elderly male in NAD HEENT: normal Neck: JVP - normal Lungs: CTA bilaterally CV: RRR with 2/6 harsh mid peaking systolic murmur at the right upper sternal border Abd: soft, NT, Positive BS, no hepatomegaly Ext: no C/C/E, right radial hematoma and left calf tenderness unchanged from previous exams Skin: warm/dry no rash   Lab Results:  Recent Labs  09/07/16 0308 09/08/16 0202  WBC 9.6 7.8  HGB 8.1* 7.3*  PLT 346 352    Recent Labs  09/06/16 1052 09/07/16 0308  NA 140 138  K 4.4 4.0  CL 109 107  CO2 25 26  GLUCOSE 163* 150*  BUN 28* 26*  CREATININE 1.46* 1.39*    Recent Labs  09/06/16 2118 09/07/16 0308  TROPONINI 0.04* 0.04*    Cardiac Studies: 2-D echocardiogram pending  Tele: Normal sinus rhythm  Assessment/Plan:  1. Unstable angina: troponin minimally elevated. No recurrent chest pain. Patient is continued on aspirin and Plavix. He is on IV heparin now off of Eliquis x 48 hrs. Plans for cardiac catheterization/PCI of the right coronary artery today after PRBC's administered.   2. Ischemic cardiomyopathy/chronic systolic heart failure: Appears compensated. Blood pressure remains in low normal range and unable to further titrate medications. Repeat 2-D echocardiogram pending.  3. Anemia, normocytic: Hemoglobin slowly trending down. No evidence of bleeding. Suspect chronic disease/phlebotomy. Will transfuse 1 unit prbc prior to PCI today.   4.  Left lower extremity below-knee DVT: Currently on heparin, will resume Xarelto post PCI with plans for a total of 6 weeks of anticoagulation. The patient is scheduled to have a follow-up venous duplex at Dr. Dickson's office after 6 weeks of treatment.  5. Aortic stenosis: reassess with repeat echo study.  6. Disposition: Plan on physical therapy evaluation tomorrow. The patient wishes to go home after he is discharged from the hospital. I'm not sure he will be strong enough.  Jabarri Stefanelli, M.D. 09/08/2016, 7:35 AM     

## 2016-09-08 NOTE — Progress Notes (Signed)
    Subjective:  No chest pain or shortness of breath. No complaints this morning.  Objective:  Vital Signs in the last 24 hours: Temp:  [98.4 F (36.9 C)-98.6 F (37 C)] 98.6 F (37 C) (10/05 0419) Pulse Rate:  [71-82] 71 (10/05 0419) Resp:  [18-20] 20 (10/05 0419) BP: (98-119)/(44-87) 116/44 (10/05 0419) SpO2:  [95 %-100 %] 98 % (10/05 0419)  Intake/Output from previous day: 10/04 0701 - 10/05 0700 In: 588 [P.O.:480; I.V.:108] Out: 700 [Urine:700]  Physical Exam: Pt is alert and oriented, pleasant elderly male in NAD HEENT: normal Neck: JVP - normal Lungs: CTA bilaterally CV: RRR with 2/6 harsh mid peaking systolic murmur at the right upper sternal border Abd: soft, NT, Positive BS, no hepatomegaly Ext: no C/C/E, right radial hematoma and left calf tenderness unchanged from previous exams Skin: warm/dry no rash   Lab Results:  Recent Labs  09/07/16 0308 09/08/16 0202  WBC 9.6 7.8  HGB 8.1* 7.3*  PLT 346 352    Recent Labs  09/06/16 1052 09/07/16 0308  NA 140 138  K 4.4 4.0  CL 109 107  CO2 25 26  GLUCOSE 163* 150*  BUN 28* 26*  CREATININE 1.46* 1.39*    Recent Labs  09/06/16 2118 09/07/16 0308  TROPONINI 0.04* 0.04*    Cardiac Studies: 2-D echocardiogram pending  Tele: Normal sinus rhythm  Assessment/Plan:  1. Unstable angina: troponin minimally elevated. No recurrent chest pain. Patient is continued on aspirin and Plavix. He is on IV heparin now off of Eliquis x 48 hrs. Plans for cardiac catheterization/PCI of the right coronary artery today after PRBC's administered.   2. Ischemic cardiomyopathy/chronic systolic heart failure: Appears compensated. Blood pressure remains in low normal range and unable to further titrate medications. Repeat 2-D echocardiogram pending.  3. Anemia, normocytic: Hemoglobin slowly trending down. No evidence of bleeding. Suspect chronic disease/phlebotomy. Will transfuse 1 unit prbc prior to PCI today.   4.  Left lower extremity below-knee DVT: Currently on heparin, will resume Xarelto post PCI with plans for a total of 6 weeks of anticoagulation. The patient is scheduled to have a follow-up venous duplex at Dr. Adele Danickson's office after 6 weeks of treatment.  5. Aortic stenosis: reassess with repeat echo study.  6. Disposition: Plan on physical therapy evaluation tomorrow. The patient wishes to go home after he is discharged from the hospital. I'm not sure he will be strong enough.  Tonny Bollmanooper, Conna Terada, M.D. 09/08/2016, 7:35 AM

## 2016-09-08 NOTE — Progress Notes (Signed)
ANTICOAGULATION CONSULT NOTE   Pharmacy Consult for heparin Indication: chest pain/ACS  Assessment: Pt is a [age] YOM seen in ED for chest pain. Pt reported substernal chest pain 10/3 at SNF, took one dose SL NTG w/o relief, had moderate relief after 3rd dose of SL NTG. Pt had coronary stent placement on 08/24/16 and has severe RCA stenosis with plans for RAC staged intervention. This is first reported episode of angina since discharge on 08/30/16.   PTT now therapeutic x 2  Goal of Therapy:  aptt goal 66-102 Heparin level 0.3-0.7 units/ml Monitor platelets by anticoagulation protocol: Yes   Plan:  Continue heparin at 900 units / hr Follow up plans after cath today, on apixaban prior to admission      No Known Allergies  Patient Measurements: Height: 6\' 1"  (185.4 cm) Weight: 228 lb (103.4 kg) IBW/kg (Calculated) : 79.9 Heparin Dosing Weight: 100.9  Vital Signs: Temp: 98.6 F (37 C) (10/05 0419) Temp Source: Oral (10/05 0419) BP: 116/44 (10/05 0419) Pulse Rate: 71 (10/05 0419)  Labs:  Recent Labs  09/06/16 1052 09/06/16 1535  09/06/16 2118 09/07/16 0308 09/07/16 0953 09/07/16 2226 09/08/16 0202 09/08/16 0735  HGB 8.3*  --   --   --  8.1*  --   --  7.3*  --   HCT 26.4*  --   --   --  26.5*  --   --  23.5*  --   PLT 349  --   --   --  346  --   --  352  --   APTT  --   --   < > 42*  --  >200* 87*  --  94*  LABPROT  --   --   --   --  21.1*  --   --   --   --   INR  --   --   --   --  1.80  --   --   --   --   HEPARINUNFRC  --   --   --  >2.20*  --  >2.20*  --  >2.20*  --   CREATININE 1.46*  --   --   --  1.39*  --   --   --   --   TROPONINI 0.03* 0.03*  --  0.04* 0.04*  --   --   --   --   < > = values in this interval not displayed.  Estimated Creatinine Clearance: 48.2 mL/min (by C-G formula based on SCr of 1.39 mg/dL (H)).   Medical History: Past Medical History:  Diagnosis Date  . Arthritis    "knees" (09/06/2016)  . CHF (congestive heart failure) (HCC)    . Coronary artery disease   . DVT (deep venous thrombosis) (HCC) 09/06/2016   LLE  . GERD (gastroesophageal reflux disease)    depending on diet  . High cholesterol   . Hypertension   . Type II diabetes mellitus (HCC)     Medications:  Eliquis PTA  Thank you Tim Hopkins, PharmD [phone] 09/08/2016 9:50 AM

## 2016-09-08 NOTE — Care Management Note (Signed)
Case Management Note  Patient Details  Name: Tim GratesJohn A Bunton Jr. MRN: [id number] Date of Birth: October 14, 1928  Subjective/Objective:  [facility], s/p coronary stent intervention , already on Eliquis, plavix and asa, NCM will cont to follow for dc needs and refer to CSW.                  Action/Plan:   Expected Discharge Date:                  Expected Discharge Plan:  Skilled Nursing Facility  In-House Referral:  Clinical Social Work  Discharge planning Services  CM Consult  Post Acute Care Choice:    Choice offered to:     DME Arranged:    DME Agency:     HH Arranged:    HH Agency:     Status of Service:  In process, will continue to follow  If discussed at Long Length of Stay Meetings, dates discussed:    Additional Comments:  Leone Havenaylor, Dreshaun Stene Clinton, RN 09/08/2016, 6:18 PM

## 2016-09-08 NOTE — Interval H&P Note (Signed)
History and Physical Interval Note:  09/08/2016 2:59 PM  Tim A Erskine SquibbHill Jr.  has presented today for surgery, with the diagnosis of CP - Unstable Angina. The various methods of treatment have been discussed with the patient and family. After consideration of risks, benefits and other options for treatment, the patient has consented to  Procedure(s): Coronary Stent Intervention (N/A) with Left Heart Catheterization with Coronary Angiography as a surgical intervention .  The patient's history has been reviewed, patient examined, no change in status, stable for surgery.  I have reviewed the patient's chart and labs.  Questions were answered to the patient's satisfaction.    Cath Lab Visit (complete for each Cath Lab visit)  Clinical Evaluation Leading to the Procedure:   ACS: Yes.    Non-ACS:    Anginal Classification: CCS III  Anti-ischemic medical therapy: Maximal Therapy (2 or more classes of medications)  Non-Invasive Test Results: Equivocal test results - Severe CAD & Cardiomyopathy  Prior CABG: No previous CABG   Bryan Lemmaavid Harding

## 2016-09-09 ENCOUNTER — Encounter (HOSPITAL_COMMUNITY): Payer: Self-pay | Admitting: Cardiology

## 2016-09-09 ENCOUNTER — Inpatient Hospital Stay (HOSPITAL_COMMUNITY): Payer: Medicare Other

## 2016-09-09 DIAGNOSIS — I350 Nonrheumatic aortic (valve) stenosis: Secondary | ICD-10-CM

## 2016-09-09 LAB — APTT: APTT: 38 s — AB (ref 24–36)

## 2016-09-09 LAB — TYPE AND SCREEN
ABO/RH(D): B POS
Antibody Screen: NEGATIVE
Unit division: 0

## 2016-09-09 LAB — CBC
HEMATOCRIT: 28.7 % — AB (ref 39.0–52.0)
Hemoglobin: 8.9 g/dL — ABNORMAL LOW (ref 13.0–17.0)
MCH: 28.6 pg (ref 26.0–34.0)
MCHC: 31 g/dL (ref 30.0–36.0)
MCV: 92.3 fL (ref 78.0–100.0)
PLATELETS: 313 10*3/uL (ref 150–400)
RBC: 3.11 MIL/uL — ABNORMAL LOW (ref 4.22–5.81)
RDW: 14.8 % (ref 11.5–15.5)
WBC: 8.1 10*3/uL (ref 4.0–10.5)

## 2016-09-09 LAB — BASIC METABOLIC PANEL
Anion gap: 11 (ref 5–15)
BUN: 24 mg/dL — AB (ref 6–20)
CHLORIDE: 103 mmol/L (ref 101–111)
CO2: 23 mmol/L (ref 22–32)
CREATININE: 1.3 mg/dL — AB (ref 0.61–1.24)
Calcium: 8.3 mg/dL — ABNORMAL LOW (ref 8.9–10.3)
GFR calc Af Amer: 56 mL/min — ABNORMAL LOW (ref >60)
GFR calc non Af Amer: 48 mL/min — ABNORMAL LOW (ref >60)
GLUCOSE: 140 mg/dL — AB (ref 65–99)
POTASSIUM: 4 mmol/L (ref 3.5–5.1)
Sodium: 137 mmol/L (ref 135–145)

## 2016-09-09 LAB — GLUCOSE, CAPILLARY
GLUCOSE-CAPILLARY: 181 mg/dL — AB (ref 65–99)
Glucose-Capillary: 132 mg/dL — ABNORMAL HIGH (ref 65–99)
Glucose-Capillary: 164 mg/dL — ABNORMAL HIGH (ref 65–99)

## 2016-09-09 LAB — ECHOCARDIOGRAM LIMITED
HEIGHTINCHES: 73 in
Weight: 3648 oz

## 2016-09-09 LAB — HEPARIN LEVEL (UNFRACTIONATED)

## 2016-09-09 MED ORDER — SODIUM CHLORIDE 0.9 % IV BOLUS (SEPSIS)
250.0000 mL | Freq: Once | INTRAVENOUS | Status: AC
  Administered 2016-09-09: 250 mL via INTRAVENOUS

## 2016-09-09 MED ORDER — ANGIOPLASTY BOOK
Freq: Once | Status: AC
  Administered 2016-09-09: 1
  Filled 2016-09-09: qty 1

## 2016-09-09 MED ORDER — SODIUM CHLORIDE 0.9 % IV SOLN
INTRAVENOUS | Status: AC
  Administered 2016-09-09: 10:00:00 via INTRAVENOUS

## 2016-09-09 MED ORDER — FERROUS SULFATE 325 (65 FE) MG PO TABS
325.0000 mg | ORAL_TABLET | Freq: Two times a day (BID) | ORAL | Status: DC
  Administered 2016-09-09 – 2016-09-13 (×8): 325 mg via ORAL
  Filled 2016-09-09 (×8): qty 1

## 2016-09-09 NOTE — Progress Notes (Signed)
CARDIAC REHAB PHASE I   Pt to chair this morning with RN staff, BP low, c/o dizziness, returned to bed, receiving bolus. Will await PT eval prior to ambulation with cardiac rehab. Reviewed risk factors, anti-platelet therapy, stent card, activity restrictions, ntg, activity progression, heart healthy/diabetes diet, sodium restrictions, and phase 2 cardiac rehab. Pt verbalized understanding. Pt agrees to phase 2 cardiac rehab referral, functional status will likely limit pt's ability to participate, will send referral to [place] per pt request. Pt in bed, call bell within reach, will follow progress.     [id number] Joylene GrapesEmily C Antuan Limes, RN, BSN 09/09/2016 9:23 AM

## 2016-09-09 NOTE — Progress Notes (Signed)
Patient reported feeling somewhat woozy this morning with drop in blood pressure.  Assisted to lie down in bed and IV fluids given per order.  At this time BP improved and patient feeling a bit better.  Resting comfortably in recliner at bedside.  PT eval pending in AM.

## 2016-09-09 NOTE — Plan of Care (Signed)
Problem: Phase I Progression Outcomes Goal: Initial discharge plan identified Outcome: Progressing Social worker consulted for placement

## 2016-09-09 NOTE — Discharge Summary (Addendum)
Patient Name: Tim Hopkins. Date of Encounter: 09/09/2016  Primary Cardiologist: Dr. Fausto Skillern Problem List     Active Problems:   Unstable angina (HCC)   Pressure injury of skin     Subjective     Inpatient Medications    Scheduled Meds: . apixaban  5 mg Oral BID  . aspirin EC  81 mg Oral Daily  . atorvastatin  40 mg Oral q1800  . clopidogrel  75 mg Oral Q breakfast  . ferrous sulfate  325 mg Oral Q breakfast  . finasteride  5 mg Oral Daily  . lisinopril  5 mg Oral Daily  . metoprolol tartrate  25 mg Oral BID  . pantoprazole  40 mg Oral Daily  . senna-docusate  1 tablet Oral QHS  . sodium chloride  250 mL Intravenous Once  . sodium chloride flush  3 mL Intravenous Q12H  . tamsulosin  0.8 mg Oral QPC supper   Continuous Infusions:  PRN Meds:.sodium chloride, acetaminophen, nitroGLYCERIN, ondansetron (ZOFRAN) IV, sodium chloride flush   Vital Signs    Vitals:   09/08/16 2200 09/09/16 0000 09/09/16 0400 09/09/16 0614  BP: (!) 114/45 (!) 115/39 (!) 106/29 (!) 118/44  Pulse: 74 64  61  Resp: (!) 22 (!) 22  (!) 22  Temp:  98.4 F (36.9 C) 97.6 F (36.4 C)   TempSrc:  Oral Oral   SpO2: 96% 96%  100%  Weight:      Height:        Intake/Output Summary (Last 24 hours) at 09/09/16 0823 Last data filed at 09/09/16 0400  Gross per 24 hour  Intake          1005.08 ml  Output              725 ml  Net           280.08 ml   Filed Weights   09/06/16 1004  Weight: 228 lb (103.4 kg)    Physical Exam   GEN: Deconditioned appearing male in no acute distress.  HEENT: Grossly normal.  Neck: Supple, no JVD, carotid bruits, or masses. Cardiac: RRR, 2/6 systolic murmur. No rubs, or gallops. No clubbing, cyanosis, edema.  Radials/DP/PT 2+ and equal bilaterally.  Respiratory:  Respirations regular and unlabored, clear to auscultation bilaterally. GI: Soft, nontender, nondistended, BS + x 4. MS: no deformity or atrophy. Skin: warm and dry, no rash. Neuro:   Strength and sensation are intact. Psych: AAOx3.  Normal affect.  Labs    CBC  Recent Labs  09/06/16 1052  09/08/16 0202 09/08/16 1525 09/09/16 0423  WBC 10.1  < > 7.8  --  8.1  NEUTROABS 7.8*  --   --   --   --   HGB 8.3*  < > 7.3* 9.2* 8.9*  HCT 26.4*  < > 23.5* 27.0* 28.7*  MCV 91.7  < > 92.2  --  92.3  PLT 349  < > 352  --  313  < > = values in this interval not displayed. Basic Metabolic Panel  Recent Labs  09/07/16 0308 09/08/16 1525 09/09/16 0423  NA 138 141 137  K 4.0 4.0 4.0  CL 107 105 103  CO2 26  --  23  GLUCOSE 150* 126* 140*  BUN 26* 23* 24*  CREATININE 1.39* 1.20 1.30*  CALCIUM 8.2*  --  8.3*   Liver Function Tests  Recent Labs  09/06/16 1052  AST 22  ALT 30  ALKPHOS 39  BILITOT 0.9  PROT 5.6*  ALBUMIN 2.5*   Cardiac Enzymes  Recent Labs  09/06/16 1535 09/06/16 2118 09/07/16 0308  TROPONINI 0.03* 0.04* 0.04*     Telemetry    NSR- Personally Reviewed  ECG    NSR, LVH- Personally Reviewed  Radiology    No results found.   Cardiac Studies   Coronary Stent Intervention  Left Heart Cath and Coronary Angiography 09/08/16   Prox LAD to Mid LAD lesion, 65 %stenosed. Dist LAD lesion, 65 %stenosed. - Plan is Medical managment for now  Widely patent LM Stent: 10 %stenosed (similar to Post PCI)  Mid RCA lesion, 80 %stenosed.  A STENT PROMUS PREM MR 3.0X16 drug eluting stent was successfully placed.  Post intervention, there is a 0% residual stenosis.  Prox RCA lesion, 90 %stenosed.  A STENT PROMUS PREM MR 3.0X12 drug eluting stent was successfully placed.  Post intervention, there is a 0% residual stenosis.  There is moderate to severe left ventricular systolic dysfunction. EF ~30-35% - notably improved from 20-25%   Successful 2 site PCI to the RCA with DES stents. Widely patent left main and LAD stent with stable extensive disease in the mid LAD that would require significant stent, and plan is medical  management.  Plan:  Transferred to 6 Central posterior unit for TR band removal.  Restart Eliquis tonight. Plan would be to complete a 3 month course of Eliquis for DVT along with aspirin and Plavix.  Would continue Plavix indefinitely, and preferably continue aspirin for the first year.  Monitor for anemia.    Patient Profile  Tim Hopkins is a 82 year old male with a past medical history of CAD s/p recent Impella guided stenting to his left main on 08/24/16. He also has a history of ischemic cardiomyopathy, chronic combined systolic and diastolic CHF, DM and HTN. He presented with chest pain on 09/06/16 and underwent DES x 2 placement to his proximal and mid RCA.   Assessment & Plan  1. Unstable angina with history of CAD: s/p DES x 2 to RCA. Denies chest pain. Will be on triple therapy for 3 months with ASA+Plavix+Eliquis. Then after 3 months - will be on ASA+ Plavix. Has a LLE DVT.    2. Ischemic cardiomyopathy/chronic systolic heart failure: Appears compensated. Blood pressure remains in low normal range and unable to further titrate medications. Repeat 2-D echocardiogram pending.  3. Anemia, normocytic: Hemoglobin slowly trending down. No evidence of bleeding. Suspect chronic disease/phlebotomy. Had one unit PRBC's yesterday, Hbg is stable today.   4. Left lower extremity below-knee DVT: Resumed Xarelto post PCI with plans for a total of 6 weeks of anticoagulation. The patient is scheduled to have a follow-up venous duplex at Dr. Adele Dan office after 6 weeks of treatment.  5. Aortic stenosis: reassess with repeat echo study.  Signed, Little Ishikawa, NP  09/09/2016, 8:23 AM   Patient seen, examined. Available data reviewed. Agree with findings, assessment, and plan as outlined by Suzzette Righter, NP. The patient is independently interviewed and examined. He is an elderly male in no distress. Lungs are clear, heart is regular rate and rhythm with a 3/6 harsh systolic murmur best heard at  the apex, abdomen is soft and nontender, extremity exam is unchanged. The right radial hematoma is stable, there is a left radial hematoma that is small. Hemoglobin is stable after 1 unit of packed red blood cells this morning. The patient is weak. We are going to give him  some fluids this morning. Will do a physical therapy assessment to help with his disposition. He absolutely will not go back to the skilled nursing facility from where he came. Left ventricular function looked improved from cardiac catheterization yesterday. Repeat 2-D echocardiogram is pending. Considering the patient's low blood pressure this morning, will hold his lisinopril. Continue metoprolol.  Tonny BollmanMichael Ayda Tancredi, M.D. 09/09/2016 9:14 AM

## 2016-09-09 NOTE — Care Management Important Message (Signed)
Important Message  Patient Details  Name: Tim GratesJohn A Howdyshell Jr. MRN: [id number] Date of Birth: Jun 09, 1928   Medicare Important Message Given:  Yes    Tim Hopkins 09/09/2016, 12:03 PM

## 2016-09-09 NOTE — Progress Notes (Signed)
  Echocardiogram 2D Echocardiogram has been performed.  Arvil ChacoFoster, Jesi Jurgens 09/09/2016, 10:43 AM

## 2016-09-09 NOTE — Discharge Instructions (Addendum)
Information on my medicine - ELIQUIS (apixaban)  This medication education was reviewed with me or my healthcare representative as part of my discharge preparation.   Why was Eliquis prescribed for you? Eliquis was prescribed to treat blood clots that may have been found in the veins of your legs (deep vein thrombosis) or in your lungs (pulmonary embolism) and to reduce the risk of them occurring again.  What do You need to know about Eliquis ?  the dose is ONE 5 mg tablet taken TWICE daily.  Eliquis may be taken with or without food.   Try to take the dose about the same time in the morning and in the evening. If you have difficulty swallowing the tablet whole please discuss with your pharmacist how to take the medication safely.  Take Eliquis exactly as prescribed and DO NOT stop taking Eliquis without talking to the doctor who prescribed the medication.  Stopping may increase your risk of developing a new blood clot.  Refill your prescription before you run out.  After discharge, you should have regular check-up appointments with your healthcare provider that is prescribing your Eliquis.    What do you do if you miss a dose? If a dose of ELIQUIS is not taken at the scheduled time, take it as soon as possible on the same day and twice-daily administration should be resumed. The dose should not be doubled to make up for a missed dose.  Important Safety Information A possible side effect of Eliquis is bleeding. You should call your healthcare provider right away if you experience any of the following: ? Bleeding from an injury or your nose that does not stop. ? Unusual colored urine (red or dark brown) or unusual colored stools (red or black). ? Unusual bruising for unknown reasons. ? A serious fall or if you hit your head (even if there is no bleeding).  Some medicines may interact with Eliquis and might increase your risk of bleeding or clotting while on Eliquis. To help avoid  this, consult your healthcare provider or pharmacist prior to using any new prescription or non-prescription medications, including herbals, vitamins, non-steroidal anti-inflammatory drugs (NSAIDs) and supplements.  This website has more information on Eliquis (apixaban): http://www.eliquis.com/eliquis/home Information on my medicine - ELIQUIS (apixaban)  This medication education was reviewed with me or my healthcare representative as part of my discharge preparation.  The pharmacist that spoke with me during my hospital stay was:  Herby Abraham, [facility]  Why was Eliquis prescribed for you? Eliquis was prescribed to treat blood clots that may have been found in the veins of your legs (deep vein thrombosis) or in your lungs (pulmonary embolism) and to reduce the risk of them occurring again.  What do You need to know about Eliquis ? he dose is ONE 5 mg tablet taken TWICE daily.  Eliquis may be taken with or without food.   Try to take the dose about the same time in the morning and in the evening. If you have difficulty swallowing the tablet whole please discuss with your pharmacist how to take the medication safely.  Take Eliquis exactly as prescribed and DO NOT stop taking Eliquis without talking to the doctor who prescribed the medication.  Stopping may increase your risk of developing a new blood clot.  Refill your prescription before you run out.  After discharge, you should have regular check-up appointments with your healthcare provider that is prescribing your Eliquis.    What do you do if  you miss a dose? If a dose of ELIQUIS is not taken at the scheduled time, take it as soon as possible on the same day and twice-daily administration should be resumed. The dose should not be doubled to make up for a missed dose.  Important Safety Information A possible side effect of Eliquis is bleeding. You should call your healthcare provider right away if you experience any of the  following: ? Bleeding from an injury or your nose that does not stop. ? Unusual colored urine (red or dark brown) or unusual colored stools (red or black). ? Unusual bruising for unknown reasons. ? A serious fall or if you hit your head (even if there is no bleeding).  Some medicines may interact with Eliquis and might increase your risk of bleeding or clotting while on Eliquis. To help avoid this, consult your healthcare provider or pharmacist prior to using any new prescription or non-prescription medications, including herbals, vitamins, non-steroidal anti-inflammatory drugs (NSAIDs) and supplements.  This website has more information on Eliquis (apixaban): http://www.eliquis.com/eliquis/home

## 2016-09-10 MED ORDER — NITROGLYCERIN 0.4 MG SL SUBL: 0.4000 mg | SUBLINGUAL_TABLET | SUBLINGUAL | 3 refills | Status: AC | PRN

## 2016-09-10 MED ORDER — APIXABAN 5 MG PO TABS: 5.0000 mg | ORAL_TABLET | Freq: Two times a day (BID) | ORAL | Status: DC

## 2016-09-10 NOTE — Progress Notes (Addendum)
   Subjective: No CP  Breathing OK  Eager to go home Objective: Vitals:   09/09/16 2222 09/09/16 2223 09/09/16 2224 09/10/16 0228  BP:  (!) 138/44 (!) 138/44 (!) 119/49  Pulse:   82   Resp: 19 (!) 21  13  Temp:    97.7 F (36.5 C)  TempSrc:    Oral  SpO2:    97%  Weight:    212 lb 8.4 oz (96.4 kg)  Height:       Weight change:   Intake/Output Summary (Last 24 hours) at 09/10/16 [id number] Last data filed at 09/10/16 0400  Gross per 24 hour  Intake             1160 ml  Output             1476 ml  Net             -316 ml    General: Alert, awake, oriented x3, in no acute distress Neck:  JVP is normal Heart: Regular rate and rhythm, Gr III/ VI systolic murmur Apex  To base  rubs, gallops.  Lungs: Clear to auscultation.  No rales or wheezes. Exemities:  No edema.   Neuro: Grossly intact, nonfocal.  Tele:  SR   Lab Results: Results for orders placed or performed during the hospital encounter of 09/06/16 (from the past 24 hour(s))  Glucose, capillary     Status: Abnormal   Collection Time: 09/09/16  7:32 AM  Result Value Ref Range   Glucose-Capillary 132 (H) 65 - 99 mg/dL  Glucose, capillary     Status: Abnormal   Collection Time: 09/09/16 12:26 PM  Result Value Ref Range   Glucose-Capillary 164 (H) 65 - 99 mg/dL  Glucose, capillary     Status: Abnormal   Collection Time: 09/09/16  5:27 PM  Result Value Ref Range   Glucose-Capillary 181 (H) 65 - 99 mg/dL    Studies/Results: No results found.  Medications:   @PROBHOSP @  1  [place]  S/p DES x 2 to RCA  On ASA Plavvix and Eliquis for 3 month the 3 mon ASA and PLAVIX  Will neeed outpt f/u    2  DVT  Continue NOAC  F/U with [facility] Dicks[facility]'s office after 6 wks  3  ICM     LVEF 40 to 45%  Improved from report of 9/17 when 20 to 25%  4  Anemia  Will need to be followed closely    5  AS  Mod to mod on echo yesterday  Mean gradient 25 mm Hg  NOt MR (? MVP) with mod regurg  Will f/u as outpt    Pt stable for d/c to SNF   LOS: 4  days   Dietrich PatesPaula Ross 09/10/2016, 6:21 AM
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
## 2016-09-10 NOTE — Progress Notes (Signed)
Pt received from [facility]. Pt oriented to room and equipment. VSS. Telemetry applied, CCMD notified x2. Call light within reach, will continue to monitor.   Leonidas Rombergaitlin S Bumbledare, RN

## 2016-09-11 LAB — BASIC METABOLIC PANEL
ANION GAP: 10 (ref 5–15)
BUN: 26 mg/dL — ABNORMAL HIGH (ref 6–20)
CALCIUM: 8.5 mg/dL — AB (ref 8.9–10.3)
CHLORIDE: 105 mmol/L (ref 101–111)
CO2: 23 mmol/L (ref 22–32)
Creatinine, Ser: 1.45 mg/dL — ABNORMAL HIGH (ref 0.61–1.24)
GFR calc non Af Amer: 42 mL/min — ABNORMAL LOW (ref >60)
GFR, EST AFRICAN AMERICAN: 49 mL/min — AB (ref >60)
GLUCOSE: 145 mg/dL — AB (ref 65–99)
Potassium: 4.1 mmol/L (ref 3.5–5.1)
Sodium: 138 mmol/L (ref 135–145)

## 2016-09-11 LAB — CBC
HEMATOCRIT: 28.9 % — AB (ref 39.0–52.0)
HEMOGLOBIN: 8.9 g/dL — AB (ref 13.0–17.0)
MCH: 28.7 pg (ref 26.0–34.0)
MCHC: 30.8 g/dL (ref 30.0–36.0)
MCV: 93.2 fL (ref 78.0–100.0)
Platelets: 324 10*3/uL (ref 150–400)
RBC: 3.1 MIL/uL — ABNORMAL LOW (ref 4.22–5.81)
RDW: 15.1 % (ref 11.5–15.5)
WBC: 7.2 10*3/uL (ref 4.0–10.5)

## 2016-09-11 NOTE — Clinical Social Work Placement (Signed)
   CLINICAL SOCIAL WORK PLACEMENT  NOTE  Date:  09/11/2016  Patient Details  Name: Tim GratesJohn A Escalante Jr. MRN: [id number] Date of Birth: Dec 03, 1928  Clinical Social Work is seeking post-discharge placement for this patient at the Skilled  Nursing Facility level of care (*CSW will initial, date and re-position this form in  chart as items are completed):  Yes   Patient/family provided with [facility] Clinical Social Work Department's list of facilities offering this level of care within the geographic area requested by the patient (or if unable, by the patient's family).  Yes   Patient/family informed of their freedom to choose among providers that offer the needed level of care, that participate in Medicare, Medicaid or managed care program needed by the patient, have an available bed and are willing to accept the patient.  Yes   Patient/family informed of [facility]'s ownership interest in [facility] and [facility], as well as of the fact that they are under no obligation to receive care at these facilities.  PASRR submitted to EDS on       PASRR number received on       Existing PASRR number confirmed on 09/11/16     FL2 transmitted to all facilities in geographic area requested by pt/family on       FL2 transmitted to all facilities within larger geographic area on       Patient informed that his/her managed care company has contracts with or will negotiate with certain facilities, including the following:        No   Patient/family informed of bed offers received.  Patient chooses bed at       Physician recommends and patient chooses bed at      Patient to be transferred to   on  .  Patient to be transferred to facility by       Patient family notified on   of transfer.  Name of family member notified:        PHYSICIAN       Additional Comment:    _______________________________________________ Linna CapriceRAKE, Aylah Yeary M, LCSW 09/11/2016, 3:31 PM

## 2016-09-11 NOTE — Progress Notes (Signed)
   Subjective: No CP  Breathing is OK   Objective: Vitals:   09/10/16 1340 09/10/16 2005 09/10/16 2143 09/11/16 0543  BP: (!) 109/51 (!) 116/31 (!) 140/52 (!) 107/47  Pulse: 66 70 77 66  Resp: 18 18 18 20   Temp: 98.1 F (36.7 C) 98.6 F (37 C) 98.4 F (36.9 C) 98 F (36.7 C)  TempSrc: Oral Oral Oral Oral  SpO2: 100% 100% 97% 98%  Weight:      Height:       Weight change:   Intake/Output Summary (Last 24 hours) at 09/11/16 1136 Last data filed at 09/11/16 0741  Gross per 24 hour  Intake              480 ml  Output              860 ml  Net             -380 ml    General: Alert, awake, oriented x3, in no acute distress Neck:  JVP is normal Heart: Regular rate and rhythm,II/VI systolic murmur  No rubs, gallops.  Lungs: Clear to auscultation.  No rales or wheezes. Exemities:  L leg swollen.   Neuro: Grossly intact, nonfocal.  TEle  SR   Lab Results: Results for orders placed or performed during the hospital encounter of 09/06/16 (from the past 24 hour(s))  CBC     Status: Abnormal   Collection Time: 09/11/16  2:37 AM  Result Value Ref Range   WBC 7.2 4.0 - 10.5 K/uL   RBC 3.10 (L) 4.22 - 5.81 MIL/uL   Hemoglobin 8.9 (L) 13.0 - 17.0 g/dL   HCT 16.128.9 (L) 09.639.0 - 04.552.0 %   MCV 93.2 78.0 - 100.0 fL   MCH 28.7 26.0 - 34.0 pg   MCHC 30.8 30.0 - 36.0 g/dL   RDW 40.915.1 81.111.5 - 91.415.5 %   Platelets 324 150 - 400 K/uL  Basic metabolic panel     Status: Abnormal   Collection Time: 09/11/16  2:37 AM  Result Value Ref Range   Sodium 138 135 - 145 mmol/L   Potassium 4.1 3.5 - 5.1 mmol/L   Chloride 105 101 - 111 mmol/L   CO2 23 22 - 32 mmol/L   Glucose, Bld 145 (H) 65 - 99 mg/dL   BUN 26 (H) 6 - 20 mg/dL   Creatinine, Ser 7.821.45 (H) 0.61 - 1.24 mg/dL   Calcium 8.5 (L) 8.9 - 10.3 mg/dL   GFR calc non Af Amer 42 (L) >60 mL/min   GFR calc Af Amer 49 (L) >60 mL/min   Anion gap 10 5 - 15    Studies/Results: No results found.  Medications: REviewed    @PROBHOSP @  1  CAD   S/pDESx 2 to RCA  On ASA Plavix and Eliquis for 3 mont  Then, ASA and PLavix for 3 month    2 DVT  ContinueNOAC  F/U with C Dicksonin6 wks  3 ICM  LVEF 40 to 45%   4  Ahemia  WIll need close outpt f/u  5  AS  Mod on echo  Mod MR  Follow    Waiting on SNF  Pt does not want to go back to the rehab he was at "they did not give me any rehab"  LOS: 5 days   Dietrich Patesaula Shianne Zeiser 09/11/2016, 11:36 AM

## 2016-09-12 ENCOUNTER — Encounter: Payer: Medicare Other | Admitting: Physician Assistant

## 2016-09-12 DIAGNOSIS — I420 Dilated cardiomyopathy: Secondary | ICD-10-CM

## 2016-09-12 DIAGNOSIS — E7800 Pure hypercholesterolemia, unspecified: Secondary | ICD-10-CM

## 2016-09-12 DIAGNOSIS — Z955 Presence of coronary angioplasty implant and graft: Secondary | ICD-10-CM

## 2016-09-12 DIAGNOSIS — I259 Chronic ischemic heart disease, unspecified: Secondary | ICD-10-CM

## 2016-09-12 DIAGNOSIS — I209 Angina pectoris, unspecified: Secondary | ICD-10-CM

## 2016-09-12 MED ORDER — POLYETHYLENE GLYCOL 3350 17 G PO PACK
17.0000 g | PACK | Freq: Every day | ORAL | Status: DC
  Administered 2016-09-12 – 2016-09-13 (×2): 17 g via ORAL
  Filled 2016-09-12 (×2): qty 1

## 2016-09-12 MED FILL — Nitroglycerin IV Soln 100 MCG/ML in D5W: INTRA_ARTERIAL | Qty: 10 | Status: AC

## 2016-09-12 NOTE — Clinical Social Work Note (Signed)
Clinical Social Work Assessment  Patient Details  Name: Tim Hopkins. MRN: [id number] Date of Birth: 04-09-1928  Date of referral:  09/12/16               Reason for consult:  Facility Placement                Permission sought to share information with:  Family Supports Permission granted to share information::  Yes, Verbal Permission Granted  Name::     Hoaglin,Marion  Agency::     Relationship::  Daughter  Contact Information:  [phone]  Housing/Transportation Living arrangements for the past 2 months:  Single Family Home Source of Information:  Patient Patient Interpreter Needed:  None Criminal Activity/Legal Involvement Pertinent to Current Situation/Hospitalization:  No - Comment as needed Significant Relationships:  Adult Children, Spouse Lives with:  Spouse Do you feel safe going back to the place where you live?  Yes Need for family participation in patient care:  Yes (Comment)  Care giving concerns: No family or friends at bedside however, pt's wife and two daughters are involved in his care and agreeable to SNF placement.    Social Worker assessment / plan:  CSW met with pt at bedside. Pt was living at home with his wife prior to admission. Pt states that he has two daughters who live out of town ( [place] and [place]) who are  temporarly at his house due to his hospitalization. Pt states that [facility] is a "NO!" Pt states that he will go to [facility], although he would rather go home. Pt inquired about how long he will have to stay. CSW expalined it depends upon his progress however, the LOS is generally 30 days. Pt agreed to go to the facility by PTAR once discharged. CSW remains avaliable for support and to facilitate pt discharge needs once medically stable.   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 Pt is agreeable to SNF placement at discharge.  Patient/Family's Understanding of and Emotional Response to Diagnosis, Current Treatment, and Prognosis:  Pt did not have any concerns regarding his medical care and/or needs. Pt is realistic about physical limitations.   Emotional Assessment Appearance:  Appears stated age Attitude/Demeanor/Rapport:   (Patient is appropriate) Affect (typically observed):  Accepting, Appropriate, Pleasant Orientation:  Oriented to Self, Oriented to Place, Oriented to  Time, Oriented to Situation Alcohol / Substance use:  Not Applicable Psych involvement (Current and /or in the community):  No (Comment)  Discharge Needs  Concerns to be addressed:  No discharge needs identified Readmission within the last 30 days:  Yes Current discharge risk:  Dependent with Mobility Barriers to Discharge:  Continued Medical Work up   [employer], [place]

## 2016-09-12 NOTE — Clinical Social Work Placement (Signed)
   CLINICAL SOCIAL WORK PLACEMENT  NOTE  Date:  09/12/2016  Patient Details  Name: Tim GratesJohn A Fiorentino Jr. MRN: [id number] Date of Birth: [birth year]  Clinical Social Work is seeking post-discharge placement for this patient at the Skilled  Nursing Facility level of care (*CSW will initial, date and re-position this form in  chart as items are completed):  Yes   Patient/family provided with [facility] Clinical Social Work Department's list of facilities offering this level of care within the geographic area requested by the patient (or if unable, by the patient's family).  Yes   Patient/family informed of their freedom to choose among providers that offer the needed level of care, that participate in Medicare, Medicaid or managed care program needed by the patient, have an available bed and are willing to accept the patient.  Yes   Patient/family informed of [facility]'s ownership interest in [facility] and [facility], as well as of the fact that they are under no obligation to receive care at these facilities.  PASRR submitted to EDS on       PASRR number received on       Existing PASRR number confirmed on 09/11/16     FL2 transmitted to all facilities in geographic area requested by pt/family on       FL2 transmitted to all facilities within larger geographic area on       Patient informed that his/her managed care company has contracts with or will negotiate with certain facilities, including the following:        No   Patient/family informed of bed offers received.  Patient chooses bed at  [facility]     Physician recommends and patient chooses bed at      Patient to be transferred to  [facility] on 09/12/16.  Patient to be transferred to facility by PTAR     Patient family notified on   of transfer.  Name of family member notified:        PHYSICIAN Please sign FL2, Please prepare priority discharge summary, including medications      Additional Comment:    _______________________________________________ Volney AmericanBridget A Mayton, LCSW 09/12/2016, 1:18 PM
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
## 2016-09-12 NOTE — Progress Notes (Signed)
09/12/2016 2:46 PM      [] Hide copied text [] Hover for attribution information Patient on BSC, had a BM, while transferring back to bed, had a vagal episode. Latoya, CNA and I got him to chair. He was unresponsive, HR dropped to 40s, called for help, we lifted him back to bed from chair. Vitals WNL. Patient came to back in bed. He is now alert and oriented, knows what happens, stated it happens all the time. Paged MD, will continue to montior.  Minerva Endsiffany N Keene Gilkey

## 2016-09-12 NOTE — Progress Notes (Signed)
Hospital Problem List     Active Problems:   Cardiomyopathy, ischemic   Unstable angina (HCC)   Pressure injury of skin   Chest pain due to myocardial ischemia [facility])   Status post coronary artery stent placement   Pure hypercholesterolemia   DCM (dilated cardiomyopathy) [facility])    Patient Profile:   Primary Cardiologist: Dr. Tenny Craw  82 yo male w/ PMH of CAD (s/p recent Impella guided stenting to his left main on 08/24/16), ischemic cardiomyopathy, chronic combined systolic and diastolic CHF, DM and HTN who presented for evaluation of chest pain. Underwent PCI on 10/5 with placement of 2 DES to RCA.  Subjective   Denies any chest discomfort or palpitations. Out of bed and sitting in chair yesterday. Awaiting SNF placement.   Inpatient Medications    . apixaban  5 mg Oral BID  . aspirin EC  81 mg Oral Daily  . atorvastatin  40 mg Oral q1800  . clopidogrel  75 mg Oral Q breakfast  . ferrous sulfate  325 mg Oral BID WC  . finasteride  5 mg Oral Daily  . metoprolol tartrate  25 mg Oral BID  . pantoprazole  40 mg Oral Daily  . senna-docusate  1 tablet Oral QHS  . sodium chloride flush  3 mL Intravenous Q12H  . tamsulosin  0.8 mg Oral QPC supper    Vital Signs    Vitals:   09/10/16 2143 09/11/16 0543 09/11/16 1406 09/12/16 0606  BP: (!) 140/52 (!) 107/47 (!) 133/33 (!) 113/46  Pulse: 77 66 64 75  Resp: 18 20 20 18   Temp: 98.4 F (36.9 C) 98 F (36.7 C) 97.7 F (36.5 C) 98.2 F (36.8 C)  TempSrc: Oral Oral Oral Oral  SpO2: 97% 98% 100% 98%  Weight:      Height:        Intake/Output Summary (Last 24 hours) at 09/12/16 1032 Last data filed at 09/12/16 0800  Gross per 24 hour  Intake              480 ml  Output              770 ml  Net             -290 ml   Filed Weights   09/06/16 1004 09/10/16 0228  Weight: 228 lb (103.4 kg) 212 lb 8.4 oz (96.4 kg)    Physical Exam    General: Elderly Caucasian male appearing in no acute distress. Head: Normocephalic,  atraumatic.  Neck: Supple without bruits, JVD not elevated. Lungs:  Resp regular and unlabored, CTA without wheezing or rales. Heart: RRR, S1, S2, no S3, S4, 2/6 SEM at RUSB; no rub. Abdomen: Soft, non-tender, non-distended with normoactive bowel sounds. No hepatomegaly. No rebound/guarding. No obvious abdominal masses. Extremities: No clubbing or cyanosis. Trace lower extremity edema. Distal pedal pulses are 2+ bilaterally. Neuro: Alert and oriented X 3. Moves all extremities spontaneously. Psych: Normal affect.  Labs    CBC  Recent Labs  09/11/16 0237  WBC 7.2  HGB 8.9*  HCT 28.9*  MCV 93.2  PLT 324   Basic Metabolic Panel  Recent Labs  09/11/16 0237  NA 138  K 4.1  CL 105  CO2 23  GLUCOSE 145*  BUN 26*  CREATININE 1.45*  CALCIUM 8.5*    Telemetry    NSR, HR in 60's - 70's. Infrequent pauses up to 1.5 seconds noted.   ECG    No new tracings.  Cardiac Studies and Radiology    Ct Abdomen Pelvis Wo Contrast  Result Date: 08/19/2016 CLINICAL DATA:  Acute abdominal and pelvic pain for 1 day. EXAM: CT ABDOMEN AND PELVIS WITHOUT CONTRAST TECHNIQUE: Multidetector CT imaging of the abdomen and pelvis was performed following the standard protocol without IV contrast. COMPARISON:  None. FINDINGS: Please note that parenchymal abnormalities may be missed without intravenous contrast. Lower chest: Small bilateral pleural effusions, small pericardial effusion and mild bibasilar atelectasis noted. Cardiomegaly and coronary artery calcifications identified. Hepatobiliary: Unremarkable. The patient is status post cholecystectomy. There is no evidence of biliary dilatation. Pancreas: Unremarkable. Spleen: Unremarkable Adrenals/Urinary Tract: Moderate to severe bilateral renal atrophy identified. There is no evidence of hydronephrosis, definite renal mass or urinary calculi. The adrenal glands and bladder are unremarkable. Stomach/Bowel: Colonic diverticulosis noted without evidence  of diverticulitis. No bowel obstruction identified. The appendix is normal. Vascular/Lymphatic: Abdominal aortic atherosclerotic calcifications noted without aneurysm. No enlarged lymph nodes identified. Reproductive: Marked prostate enlargement noted. Other: No free fluid, pneumoperitoneum or focal collection. A small left paramedian supraumbilical hernia containing fat is noted. Musculoskeletal: Degenerative changes within the lumbar spine identified. No acute or suspicious abnormality noted. IMPRESSION: Small bilateral pleural effusions, small pericardial effusion and mild bibasilar atelectasis. No evidence of acute abnormality within the abdomen or pelvis. Abdominal aortic atherosclerosis. Cardiomegaly and coronary disease. Renal atrophy and marked prostamegaly. Electronically Signed   By: Harmon PierJeffrey  Hu M.D.   On: 08/19/2016 14:47   Dg Chest 2 View  Result Date: 09/06/2016 CLINICAL DATA:  Chest pain for 3 days EXAM: CHEST  2 VIEW COMPARISON:  08/25/2016 FINDINGS: Normal heart size. Low volumes. Bibasilar atelectasis. No pneumothorax. Tiny pleural effusions are suspected with subtle blunting of the posterior costophrenic angles. No pneumothorax. No pleural effusion. IMPRESSION: Bibasilar atelectasis.  Tiny pleural effusions are suspected. Electronically Signed   By: Jolaine ClickArthur  Hoss M.D.   On: 09/06/2016 11:18   Dg Chest 2 View  Result Date: 08/19/2016 CLINICAL DATA:  Chest pain.  Abdominal pain. EXAM: CHEST  2 VIEW COMPARISON:  None. FINDINGS: Top-normal heart size . Aortic atherosclerosis. Otherwise normal mediastinal contour. No pneumothorax. Small right pleural effusion. No left pleural effusion. No overt pulmonary edema. Hyperinflated lungs. Patchy opacity at the right lung base. Curvilinear opacities at the left lung base. IMPRESSION: 1. Patchy right lung mass opacity, which could represent atelectasis, aspiration or pneumonia. 2. Curvilinear left lung base opacity, favor scarring or atelectasis. 3.  Hyperinflated lungs, suggesting obstructive lung disease. 4. Aortic atherosclerosis. Electronically Signed   By: Delbert PhenixJason A Poff M.D.   On: 08/19/2016 12:35   Dg Tibia/fibula Left  Result Date: 08/27/2016 EXAM: LEFT TIBIA AND FIBULA - 2 VIEW COMPARISON:  None. FINDINGS: No fracture or bone lesion. Knee joint is normally aligned. There is moderate to marked lateral compartment narrowing with marginal osteophytes. Ankle joint is normally aligned. Bones are diffusely demineralized. There is diffuse nonspecific subcutaneous soft tissue edema. IMPRESSION: 1. No fracture or acute bony abnormality. 2. Degenerative changes of the knee.  Ankle joint normally aligned. Electronically Signed   By: Amie Portlandavid  Ormond M.D.   On: 08/27/2016 15:21   Dg Chest Port 1 View  Result Date: 08/25/2016 CLINICAL DATA:  Congestive heart failure EXAM: PORTABLE CHEST 1 VIEW COMPARISON:  08/24/2016 FINDINGS: Cardiomediastinal silhouette is stable. Mild thoracic dextroscoliosis. Persistent right basilar atelectasis or scarring. Old right lower rib fracture. No infiltrate or pulmonary edema. Left arm PICC line with tip in left innominate vein. No pneumothorax. IMPRESSION: Mild thoracic dextroscoliosis. Persistent  right basilar atelectasis or scarring. Old right lower rib fracture. No infiltrate or pulmonary edema. Electronically Signed   By: Natasha Mead M.D.   On: 08/25/2016 09:18   Dg Chest Port 1 View  Result Date: 08/24/2016 CLINICAL DATA:  Acute myocardial infarction with acute systolic heart failure. EXAM: PORTABLE CHEST 1 VIEW COMPARISON:  08/23/2016 and 08/19/2016 FINDINGS: Intra-aortic balloon pump in place. Swan-Ganz catheter tip is in the right pulmonary artery. Pulmonary vascularity is normal. Minimal residual atelectasis at the right base. Left base has cleared. IMPRESSION: Improving aeration of the left lung base. Minimal right base atelectasis. Electronically Signed   By: Francene Boyers M.D.   On: 08/24/2016 07:41   Dg Chest  Port 1 View  Result Date: 08/23/2016 CLINICAL DATA:  On intra-aortic balloon pump assist EXAM: PORTABLE CHEST 1 VIEW COMPARISON:  08/19/2016 FINDINGS: IABP marker projected over the aortic arch. Swan-Ganz catheter inserted via inferior approach with tip over the right hilum. Shallow inspiration with elevation of the right hemidiaphragm. Atelectasis in the lung bases previous right lung opacity has improved since prior study suggesting that it probably represented pneumonia or atelectasis. Small right pleural effusion. Old right rib fracture. No pneumothorax. Aortic calcification. IMPRESSION: Shallow inspiration. Atelectasis in the lung bases. Improving infiltration or atelectasis in the right lung base since previous study. Small right pleural effusion. Appliances appear in satisfactory position. Electronically Signed   By: Burman Nieves M.D.   On: 08/23/2016 21:30    Echocardiogram: 09/09/2016 Study Conclusions  - Left ventricle: Septal and apical hypokinesis The cavity size was   severely dilated. Wall thickness was increased in a pattern of   severe LVH. Systolic function was mildly to moderately reduced.   The estimated ejection fraction was in the range of 40% to 45%.   The study is not technically sufficient to allow evaluation of LV   diastolic function. - Aortic valve: There was mild to moderate stenosis. There was mild   regurgitation. Valve area (VTI): 1.21 cm^2. Valve area (Vmax):   1.21 cm^2. Valve area (Vmean): 1.15 cm^2. - Mitral valve: Eccentric MR with possible prolapse of anterior   segment. Consider TEE to further evaluate mechanism and degree of   MR. There was moderate regurgitation. - Left atrium: The atrium was severely dilated. - Atrial septum: No defect or patent foramen ovale was identified.  Assessment & Plan    1. Unstable angina with history of CAD - underwent recent Impella guided stenting to his left main on 08/24/16. Now s/p DES x 2 to RCA this admission.  Denies chest pain. Will be on triple therapy for 3 months with ASA+Plavix+Eliquis (due to DVT). Then after 3 months - will be on ASA+ Plavix.  - continue statin and BB.   2. Ischemic cardiomyopathy/chronic systolic heart failure - echo shows EF of 40-45%, improved from 20-25% in 08/2016. - continue BB. Lisinopril discontinued in setting of hypotension. Consider resuming as outpatient pending BP and creatinine.    3. Anemia, normocytic - received 1 unit pRBC's this admission. Hgb stable at 8.9 on 09/11/2016. - no evidence of active bleeding.   4. Left lower extremity below-knee DVT - Resumed Xarelto post PCI with plans for a total of 6 weeks of anticoagulation. The patient is scheduled to have a follow-up venous duplex at Dr. Adele Dan office after 6 weeks of treatment.  5. Aortic stenosis - repeat echo showed mild to moderate stenosis. There was mild regurgitation. Valve area (VTI): 1.21 cm^2. Valve area (Vmax): 1.21 cm^2.  Valve area (Vmean): 1.15 cm^2. Eccentric MR with possible prolapse of anterior segment was noted and will need to be followed as an outpatient.  6. Stage 3 CKD - creatinine stable at 1.48 on 09/11/2016  7. Disposition - PT recommending SNF. Awaiting placement. Patient declines to go back to [facility].  Appreciate Social Work assistance.   Signed, Ellsworth Lennox , PA-C 10:32 AM 09/12/2016 Pager: [phone]

## 2016-09-12 NOTE — Clinical Social Work Note (Signed)
Insurance auth. #: [id number]: [id number]  Per PA pt will discharge 10/10 due to recent episode on 10/9. [facility] received medications online, therefore discharge summary must be sent before 4:30 on the day of. Per Lynnea FerrierKerri at [facility] there is a bed available for pt on 10/10. CSW provided [place] with insurance auth. on 10/9.   [address]Jamai Dolce [place], [place] [phone]

## 2016-09-13 ENCOUNTER — Inpatient Hospital Stay
Admission: RE | Admit: 2016-09-13 | Discharge: 2016-10-08 | Disposition: A | Discharge status: Home / Self Care | Payer: Medicare Other | Source: Ambulatory Visit | Attending: Internal Medicine | Admitting: Internal Medicine

## 2016-09-13 ENCOUNTER — Telehealth: Payer: Self-pay | Admitting: Internal Medicine

## 2016-09-13 ENCOUNTER — Encounter (HOSPITAL_COMMUNITY): Payer: Self-pay | Admitting: Student

## 2016-09-13 DIAGNOSIS — I255 Ischemic cardiomyopathy: Secondary | ICD-10-CM

## 2016-09-13 MED ORDER — POLYETHYLENE GLYCOL 3350 17 G PO PACK: 17.0000 g | PACK | Freq: Every day | ORAL | 0 refills | Status: AC

## 2016-09-13 MED ORDER — METOPROLOL TARTRATE 12.5 MG HALF TABLET: 12.5000 mg | ORAL_TABLET | Freq: Two times a day (BID) | ORAL | Status: DC

## 2016-09-13 MED ORDER — METOPROLOL TARTRATE 25 MG PO TABS: 12.5000 mg | ORAL_TABLET | Freq: Two times a day (BID) | ORAL | 0 refills | Status: DC

## 2016-09-13 NOTE — Care Management Important Message (Signed)
Important Message  Patient Details  Name: Tim GratesJohn A Clifton Jr. MRN: [id number] Date of Birth: 1928/05/16   Medicare Important Message Given:  Yes    Mallika Sanmiguel Abena 09/13/2016, 9:03 AM

## 2016-09-13 NOTE — Clinical Social Work Placement (Signed)
   CLINICAL SOCIAL WORK PLACEMENT  NOTE  Date:  09/13/2016  Patient Details  Name: Tim GratesJohn A Napoli Jr. MRN: [id number] Date of Birth: [birth year]  Clinical Social Work is seeking post-discharge placement for this patient at the Skilled  Nursing Facility level of care (*CSW will initial, date and re-position this form in  chart as items are completed):  Yes   Patient/family provided with [facility] Clinical Social Work Department's list of facilities offering this level of care within the geographic area requested by the patient (or if unable, by the patient's family).  Yes   Patient/family informed of their freedom to choose among providers that offer the needed level of care, that participate in Medicare, Medicaid or managed care program needed by the patient, have an available bed and are willing to accept the patient.  Yes   Patient/family informed of [facility]'s ownership interest in [facility] and [facility], as well as of the fact that they are under no obligation to receive care at these facilities.  PASRR submitted to EDS on       PASRR number received on       Existing PASRR number confirmed on 09/11/16     FL2 transmitted to all facilities in geographic area requested by pt/family on       FL2 transmitted to all facilities within larger geographic area on       Patient informed that his/her managed care company has contracts with or will negotiate with certain facilities, including the following:        No   Patient/family informed of bed offers received.  Patient chooses bed at  [facility]     Physician recommends and patient chooses bed at      Patient to be transferred to  [facility] on 09/12/16.  Patient to be transferred to facility by [facility]     Patient family notified on 09/13/16 of transfer.  Name of family member notified:  Seymour,Marion     PHYSICIAN Please sign FL2, Please prepare priority discharge summary, including  medications     Additional Comment:  Per MD patient is ready to discharge to  [facility] RN, patient, patient's family, and facility notified of discharge.CSW called patient's wife and was unable to reach her. RN given phone number for report and transport packet is on patient's chart. Ambulance transport requested. CSW signing off.   _______________________________________________ Reggy EyeLaShonda A Breia Ocampo, LCSW 09/13/2016, 4:30 PM

## 2016-09-13 NOTE — Telephone Encounter (Signed)
-----   Message from Ellsworth LennoxBrittany M Strader, [place] sent at 09/10/2016  7:45 AM EDT ----- [facility] Please schedule this patient for a follow-up appointment and call them with that information.  Primary Cardiologist: Seen by Dr. Tenny Crawoss but lives in [place], [place] Date of Discharge: 09/10/2016 Appointment Needed Within: 7 days Appointment Type: 7-day TOC Appointment. If no MD slots, can travel to see Samara [place]istKathryn in [place], [place].  Thank you! [place]ittany M. Iran OuchStrader,  PA-C

## 2016-09-13 NOTE — Telephone Encounter (Signed)
TOC follow up appointment per Randall AnBrittany Strader

## 2016-09-13 NOTE — Telephone Encounter (Signed)
I spoke with wife,apt fu info given,pt in [facility],they understand his discharge fu

## 2016-09-13 NOTE — Care Management Note (Signed)
Case Management Note Donn PieriniKristi Elle Vezina RN, BSN Unit 2W-Case Manager [phone]  Patient Details  Name: Tim GratesJohn A Terrance Jr. MRN: [id number] Date of Birth: 02/26/1928  Subjective/Objective:   [place]                 Action/Plan: P[facility] [place] [facility] rehab- recent discharge- CSW following for return to SNF - will need updated PT eval for return   Expected Discharge Date:    09/13/16              Expected Discharge Plan:  Skilled Nursing Facility  In-House Referral:  Clinical Social Work  Discharge planning Services  CM Consult  Post Acute Care Choice:    Choice offered to:     DME Arranged:    DME Agency:     HH Arranged:    HH Agency:     Status of Service:  Completed, signed off  If discussed at [employer] Meetings, dates discussed:  10/10  Discharge Disposition: skilled facility   Additional Comments:  09/13/16- 1030- Penelope Fittro RN, CM- [facility]- per CSW- plan to d/c there today- CSW following for placement needs.   Darrold SpanWebster, [place], RN 09/13/2016, 10:39 AM

## 2016-09-13 NOTE — Progress Notes (Signed)
Hospital Problem List     Active Problems:   Cardiomyopathy, ischemic   Unstable angina (HCC)   Pressure injury of skin   Chest pain due to myocardial ischemia [facility])   Status post coronary artery stent placement   Pure hypercholesterolemia   DCM (dilated cardiomyopathy) [facility])    Patient Profile:   Primary Cardiologist: Dr. Tenny Craw  82 yo male w/ PMH of CAD (s/p recent Impella guided stenting to his left main on 08/24/16), ischemic cardiomyopathy, chronic combined systolic and diastolic CHF, DM and HTN who presented for evaluation of chest pain. Underwent PCI on 10/5 with placement of 2 DES to RCA.  Subjective   Had vagal episode after having BM yesterday. No recurrence since. Started on Miralax. No chest discomfort or palpitations.   Inpatient Medications    . apixaban  5 mg Oral BID  . aspirin EC  81 mg Oral Daily  . atorvastatin  40 mg Oral q1800  . clopidogrel  75 mg Oral Q breakfast  . ferrous sulfate  325 mg Oral BID WC  . finasteride  5 mg Oral Daily  . metoprolol tartrate  25 mg Oral BID  . pantoprazole  40 mg Oral Daily  . polyethylene glycol  17 g Oral Daily  . senna-docusate  1 tablet Oral QHS  . sodium chloride flush  3 mL Intravenous Q12H  . tamsulosin  0.8 mg Oral QPC supper    Vital Signs    Vitals:   09/12/16 1444 09/12/16 2112 09/13/16 0608 09/13/16 0941  BP: 106/69 (!) 129/44 121/62 (!) 112/45  Pulse: (!) 42 70 66 76  Resp: 10 18 20    Temp:  98.2 F (36.8 C) 98.1 F (36.7 C)   TempSrc:  Oral Oral   SpO2: 100% 99% 97% 96%  Weight:      Height:        Intake/Output Summary (Last 24 hours) at 09/13/16 1013 Last data filed at 09/13/16 0831  Gross per 24 hour  Intake              480 ml  Output              900 ml  Net             -420 ml   Filed Weights   09/06/16 1004 09/10/16 0228  Weight: 228 lb (103.4 kg) 212 lb 8.4 oz (96.4 kg)    Physical Exam    General: Elderly Caucasian male appearing in no acute distress. Head:  Normocephalic, atraumatic.  Neck: Supple without bruits, JVD not elevated. Lungs:  Resp regular and unlabored, CTA without wheezing or rales. Heart: RRR, S1, S2, no S3, S4, 2/6 SEM at RUSB; no rub. Abdomen: Soft, non-tender, non-distended with normoactive bowel sounds. No hepatomegaly. No rebound/guarding. No obvious abdominal masses. Extremities: No clubbing or cyanosis. Trace lower extremity edema. Distal pedal pulses are 2+ bilaterally. Neuro: Alert and oriented X 3. Moves all extremities spontaneously. Psych: Normal affect.  Labs    CBC  Recent Labs  09/11/16 0237  WBC 7.2  HGB 8.9*  HCT 28.9*  MCV 93.2  PLT 324   Basic Metabolic Panel  Recent Labs  09/11/16 0237  NA 138  K 4.1  CL 105  CO2 23  GLUCOSE 145*  BUN 26*  CREATININE 1.45*  CALCIUM 8.5*    Telemetry    NSR, HR in 60's - 70's. 1st degree AV Block. Episode of bradycardia with vagal episode yesterday.  ECG    No new tracings.    Cardiac Studies and Radiology    Ct Abdomen Pelvis Wo Contrast  Result Date: 08/19/2016 CLINICAL DATA:  Acute abdominal and pelvic pain for 1 day. EXAM: CT ABDOMEN AND PELVIS WITHOUT CONTRAST TECHNIQUE: Multidetector CT imaging of the abdomen and pelvis was performed following the standard protocol without IV contrast. COMPARISON:  None. FINDINGS: Please note that parenchymal abnormalities may be missed without intravenous contrast. Lower chest: Small bilateral pleural effusions, small pericardial effusion and mild bibasilar atelectasis noted. Cardiomegaly and coronary artery calcifications identified. Hepatobiliary: Unremarkable. The patient is status post cholecystectomy. There is no evidence of biliary dilatation. Pancreas: Unremarkable. Spleen: Unremarkable Adrenals/Urinary Tract: Moderate to severe bilateral renal atrophy identified. There is no evidence of hydronephrosis, definite renal mass or urinary calculi. The adrenal glands and bladder are unremarkable. Stomach/Bowel:  Colonic diverticulosis noted without evidence of diverticulitis. No bowel obstruction identified. The appendix is normal. Vascular/Lymphatic: Abdominal aortic atherosclerotic calcifications noted without aneurysm. No enlarged lymph nodes identified. Reproductive: Marked prostate enlargement noted. Other: No free fluid, pneumoperitoneum or focal collection. A small left paramedian supraumbilical hernia containing fat is noted. Musculoskeletal: Degenerative changes within the lumbar spine identified. No acute or suspicious abnormality noted. IMPRESSION: Small bilateral pleural effusions, small pericardial effusion and mild bibasilar atelectasis. No evidence of acute abnormality within the abdomen or pelvis. Abdominal aortic atherosclerosis. Cardiomegaly and coronary disease. Renal atrophy and marked prostamegaly. Electronically Signed   By: Harmon PierJeffrey  Hu M.D.   On: 08/19/2016 14:47   Dg Chest 2 View  Result Date: 09/06/2016 CLINICAL DATA:  Chest pain for 3 days EXAM: CHEST  2 VIEW COMPARISON:  08/25/2016 FINDINGS: Normal heart size. Low volumes. Bibasilar atelectasis. No pneumothorax. Tiny pleural effusions are suspected with subtle blunting of the posterior costophrenic angles. No pneumothorax. No pleural effusion. IMPRESSION: Bibasilar atelectasis.  Tiny pleural effusions are suspected. Electronically Signed   By: Jolaine ClickArthur  Hoss M.D.   On: 09/06/2016 11:18   Dg Chest 2 View  Result Date: 08/19/2016 CLINICAL DATA:  Chest pain.  Abdominal pain. EXAM: CHEST  2 VIEW COMPARISON:  None. FINDINGS: Top-normal heart size . Aortic atherosclerosis. Otherwise normal mediastinal contour. No pneumothorax. Small right pleural effusion. No left pleural effusion. No overt pulmonary edema. Hyperinflated lungs. Patchy opacity at the right lung base. Curvilinear opacities at the left lung base. IMPRESSION: 1. Patchy right lung mass opacity, which could represent atelectasis, aspiration or pneumonia. 2. Curvilinear left lung base  opacity, favor scarring or atelectasis. 3. Hyperinflated lungs, suggesting obstructive lung disease. 4. Aortic atherosclerosis. Electronically Signed   By: Delbert PhenixJason A Poff M.D.   On: 08/19/2016 12:35   Dg Tibia/fibula Left  Result Date: 08/27/2016 EXAM: LEFT TIBIA AND FIBULA - 2 VIEW COMPARISON:  None. FINDINGS: No fracture or bone lesion. Knee joint is normally aligned. There is moderate to marked lateral compartment narrowing with marginal osteophytes. Ankle joint is normally aligned. Bones are diffusely demineralized. There is diffuse nonspecific subcutaneous soft tissue edema. IMPRESSION: 1. No fracture or acute bony abnormality. 2. Degenerative changes of the knee.  Ankle joint normally aligned. Electronically Signed   By: Amie Portlandavid  Ormond M.D.   On: 08/27/2016 15:21   Dg Chest Port 1 View  Result Date: 08/25/2016 CLINICAL DATA:  Congestive heart failure EXAM: PORTABLE CHEST 1 VIEW COMPARISON:  08/24/2016 FINDINGS: Cardiomediastinal silhouette is stable. Mild thoracic dextroscoliosis. Persistent right basilar atelectasis or scarring. Old right lower rib fracture. No infiltrate or pulmonary edema. Left arm PICC line with tip in  left innominate vein. No pneumothorax. IMPRESSION: Mild thoracic dextroscoliosis. Persistent right basilar atelectasis or scarring. Old right lower rib fracture. No infiltrate or pulmonary edema. Electronically Signed   By: Natasha Mead M.D.   On: 08/25/2016 09:18   Dg Chest Port 1 View  Result Date: 08/24/2016 CLINICAL DATA:  Acute myocardial infarction with acute systolic heart failure. EXAM: PORTABLE CHEST 1 VIEW COMPARISON:  08/23/2016 and 08/19/2016 FINDINGS: Intra-aortic balloon pump in place. Swan-Ganz catheter tip is in the right pulmonary artery. Pulmonary vascularity is normal. Minimal residual atelectasis at the right base. Left base has cleared. IMPRESSION: Improving aeration of the left lung base. Minimal right base atelectasis. Electronically Signed   By: Francene Boyers  M.D.   On: 08/24/2016 07:41   Dg Chest Port 1 View  Result Date: 08/23/2016 CLINICAL DATA:  On intra-aortic balloon pump assist EXAM: PORTABLE CHEST 1 VIEW COMPARISON:  08/19/2016 FINDINGS: IABP marker projected over the aortic arch. Swan-Ganz catheter inserted via inferior approach with tip over the right hilum. Shallow inspiration with elevation of the right hemidiaphragm. Atelectasis in the lung bases previous right lung opacity has improved since prior study suggesting that it probably represented pneumonia or atelectasis. Small right pleural effusion. Old right rib fracture. No pneumothorax. Aortic calcification. IMPRESSION: Shallow inspiration. Atelectasis in the lung bases. Improving infiltration or atelectasis in the right lung base since previous study. Small right pleural effusion. Appliances appear in satisfactory position. Electronically Signed   By: Burman Nieves M.D.   On: 08/23/2016 21:30    Echocardiogram: 09/09/2016 Study Conclusions  - Left ventricle: Septal and apical hypokinesis The cavity size was   severely dilated. Wall thickness was increased in a pattern of   severe LVH. Systolic function was mildly to moderately reduced.   The estimated ejection fraction was in the range of 40% to 45%.   The study is not technically sufficient to allow evaluation of LV   diastolic function. - Aortic valve: There was mild to moderate stenosis. There was mild   regurgitation. Valve area (VTI): 1.21 cm^2. Valve area (Vmax):   1.21 cm^2. Valve area (Vmean): 1.15 cm^2. - Mitral valve: Eccentric MR with possible prolapse of anterior   segment. Consider TEE to further evaluate mechanism and degree of   MR. There was moderate regurgitation.  - Left atrium: The atrium was severely dilated. - Atrial septum: No defect or patent foramen ovale was identified.  Assessment & Plan    1. Unstable angina with history of CAD - underwent recent Impella guided stenting to his left main on 08/24/16.  Now s/p DES x 2 to RCA this admission. Denies chest pain. Will be on triple therapy for 3 months with ASA+Plavix+Eliquis (due to DVT). Then after 3 months - will be on ASA+ Plavix.  - continue statin and BB.   2. Ischemic cardiomyopathy/chronic systolic heart failure - echo shows EF of 40-45%, improved from 20-25% in 08/2016. - continue BB. Lisinopril discontinued in setting of hypotension. Consider resuming as outpatient pending BP and creatinine.    3. Anemia, normocytic - received 1 unit pRBC's this admission. Hgb stable at 8.9 on 09/11/2016. - no evidence of active bleeding.   4. Left lower extremity below-knee DVT - Resumed Xarelto post PCI with plans for a total of 6 weeks of anticoagulation. The patient is scheduled to have a follow-up venous duplex at Dr. Adele Dan office after 6 weeks of treatment.  5. Aortic stenosis - repeat echo showed mild to moderate stenosis. There was mild  regurgitation. Valve area (VTI): 1.21 cm^2. Valve area (Vmax): 1.21 cm^2. Valve area (Vmean): 1.15 cm^2. Eccentric MR with possible prolapse of anterior segment was noted and will need to be followed as an outpatient.  6. Stage 3 CKD - creatinine stable at 1.48 on 09/11/2016  7. Vagal Episode - occurred following BM yesterday with HR dropping into the 40's. - started on Miralax to decrease straining.   8. Disposition - PT recommending SNF. Awaiting placement. Patient declines to go back to [facility]. Has bed placement at [facility].    Signed, Ellsworth Lennox , PA-C 10:13 AM 09/13/2016 Pager: [phone]

## 2016-09-13 NOTE — Discharge Summary (Signed)
Discharge Summary    Patient ID: Tim Hopkins.,  MRN: [id number], DOB/AGE: [birth year] 82 y.o.  Admit date: 09/06/2016 Discharge date: 09/13/2016  Primary Care Provider: Ignatius Specking Primary Cardiologist: F/U in [place]  Discharge Diagnoses    Active Problems:   Cardiomyopathy, ischemic   Unstable angina (HCC)   Pressure injury of skin   Chest pain due to myocardial ischemia [facility])   Status post coronary artery stent placement   Pure hypercholesterolemia   DCM (dilated cardiomyopathy) (HCC)   History of Present Illness    Tim Hopkins. is a 82 y.o. male with past medical history of CAD (s/p DES to LM stenosis on 08/24/2016), ischemic cardiomyopathy, chronic combined systolic and diastolic CHF, Type 2 DM, and HTN who presented to [facility] ED on 09/06/2016 for evaluation of chest pain.   Reported developing a substernal chest pressure earlier that morning while lying in bed. Took SL NTG with some improvement in his symptoms.   Initial troponin was negative and EKG was without any acute ischemic changes. During his recent Impella guided coronary intervention in 08/2016, he was noted to have severe RCA stenosis and staged intervention was recommended. Eliquis was held at time of admission with a Heparin bridge with anticipation of performing a cath on 09/08/2016.   Hospital Course     Consultants: None  Cyclic troponin values peaked at 0.04. Hgb was noted to be downtrending to 7.3, therefore he was transfused with 1 unit pRBC's prior to his procedure. His catheterization was performed on 10/5 and showed a widely patent LM stenosis. 90% prox-RCA stenosis along with 80% mid-RCA stenosis was noted. Two site PCI was performed with placement of DES. A stable 65% stenosis is present in the prox-mid LAD and this is to be medically managed for now. He will be on DAPT with ASA and Plavix along with completing a 3 month course of Eliquis for DVT. Recommended to continue Plavix  indefinitey and preferably continue aspirin for the first year. He tolerated the procedure well and no complications were noted.   The following morning, he denied any chest discomfort or dyspnea with exertion. He had been at SNF prior to admission but wished not to go back to that facility due to not receiving adequate physical therapy and "being left in bed for days" according to the patient. He lives with his wife who is currently undergoing home health PT. A PT consult was obtained with the recommendation of SNF.   The assistance of Social Work was greatly appreciated as he received bed placement at the [facility]. Discharge was delayed on 09/12/2016 due to his having a vagal episode following a bowel movement. He had a momentary loss of consciousness with HR in the 40's but quickly came to. He was started on Miralax to assist with constipation. No recurrence noted since.  He was last examined by Dr. Mayford Knife and deemed stable for discharge. Will follow-up at the [facility] on 09/21/2016. Will need a repeat BMET and CBC at that time to assess creatinine and Hgb. If creatinine and BP allows, would recommend resumption of Lisinopril. He was discharged in stable condition to SNF.   _____________  Discharge Vitals Blood pressure (!) 112/45, pulse 76, temperature 98.1 F (36.7 C), temperature source Oral, resp. rate 20, height 6\' 1"  (1.854 m), weight 212 lb 8.4 oz (96.4 kg), SpO2 96 %.  Filed Weights   09/06/16 1004 09/10/16 0228  Weight: 228 lb (103.4 kg)  212 lb 8.4 oz (96.4 kg)    Labs & Radiologic Studies     CBC  Recent Labs  09/11/16 0237  WBC 7.2  HGB 8.9*  HCT 28.9*  MCV 93.2  PLT 324   Basic Metabolic Panel  Recent Labs  09/11/16 0237  NA 138  K 4.1  CL 105  CO2 23  GLUCOSE 145*  BUN 26*  CREATININE 1.45*  CALCIUM 8.5*    Ct Abdomen Pelvis Wo Contrast  Result Date: 08/19/2016 CLINICAL DATA:  Acute abdominal and pelvic pain for 1 day. EXAM: CT ABDOMEN AND  PELVIS WITHOUT CONTRAST TECHNIQUE: Multidetector CT imaging of the abdomen and pelvis was performed following the standard protocol without IV contrast. COMPARISON:  None. FINDINGS: Please note that parenchymal abnormalities may be missed without intravenous contrast. Lower chest: Small bilateral pleural effusions, small pericardial effusion and mild bibasilar atelectasis noted. Cardiomegaly and coronary artery calcifications identified. Hepatobiliary: Unremarkable. The patient is status post cholecystectomy. There is no evidence of biliary dilatation. Pancreas: Unremarkable. Spleen: Unremarkable Adrenals/Urinary Tract: Moderate to severe bilateral renal atrophy identified. There is no evidence of hydronephrosis, definite renal mass or urinary calculi. The adrenal glands and bladder are unremarkable. Stomach/Bowel: Colonic diverticulosis noted without evidence of diverticulitis. No bowel obstruction identified. The appendix is normal. Vascular/Lymphatic: Abdominal aortic atherosclerotic calcifications noted without aneurysm. No enlarged lymph nodes identified. Reproductive: Marked prostate enlargement noted. Other: No free fluid, pneumoperitoneum or focal collection. A small left paramedian supraumbilical hernia containing fat is noted. Musculoskeletal: Degenerative changes within the lumbar spine identified. No acute or suspicious abnormality noted. IMPRESSION: Small bilateral pleural effusions, small pericardial effusion and mild bibasilar atelectasis. No evidence of acute abnormality within the abdomen or pelvis. Abdominal aortic atherosclerosis. Cardiomegaly and coronary disease. Renal atrophy and marked prostamegaly. Electronically Signed   By: Harmon Pier M.D.   On: 08/19/2016 14:47   Dg Chest 2 View  Result Date: 09/06/2016 CLINICAL DATA:  Chest pain for 3 days EXAM: CHEST  2 VIEW COMPARISON:  08/25/2016 FINDINGS: Normal heart size. Low volumes. Bibasilar atelectasis. No pneumothorax. Tiny pleural  effusions are suspected with subtle blunting of the posterior costophrenic angles. No pneumothorax. No pleural effusion. IMPRESSION: Bibasilar atelectasis.  Tiny pleural effusions are suspected. Electronically Signed   By: Jolaine Click M.D.   On: 09/06/2016 11:18   Dg Chest 2 View  Result Date: 08/19/2016 CLINICAL DATA:  Chest pain.  Abdominal pain. EXAM: CHEST  2 VIEW COMPARISON:  None. FINDINGS: Top-normal heart size . Aortic atherosclerosis. Otherwise normal mediastinal contour. No pneumothorax. Small right pleural effusion. No left pleural effusion. No overt pulmonary edema. Hyperinflated lungs. Patchy opacity at the right lung base. Curvilinear opacities at the left lung base. IMPRESSION: 1. Patchy right lung mass opacity, which could represent atelectasis, aspiration or pneumonia. 2. Curvilinear left lung base opacity, favor scarring or atelectasis. 3. Hyperinflated lungs, suggesting obstructive lung disease. 4. Aortic atherosclerosis. Electronically Signed   By: Delbert Phenix M.D.   On: 08/19/2016 12:35   Dg Tibia/fibula Left  Result Date: 08/27/2016 EXAM: LEFT TIBIA AND FIBULA - 2 VIEW COMPARISON:  None. FINDINGS: No fracture or bone lesion. Knee joint is normally aligned. There is moderate to marked lateral compartment narrowing with marginal osteophytes. Ankle joint is normally aligned. Bones are diffusely demineralized. There is diffuse nonspecific subcutaneous soft tissue edema. IMPRESSION: 1. No fracture or acute bony abnormality. 2. Degenerative changes of the knee.  Ankle joint normally aligned. Electronically Signed   By: Renard Hamper.D.  On: 08/27/2016 15:21   Dg Chest Port 1 View  Result Date: 08/25/2016 CLINICAL DATA:  Congestive heart failure EXAM: PORTABLE CHEST 1 VIEW COMPARISON:  08/24/2016 FINDINGS: Cardiomediastinal silhouette is stable. Mild thoracic dextroscoliosis. Persistent right basilar atelectasis or scarring. Old right lower rib fracture. No infiltrate or pulmonary  edema. Left arm PICC line with tip in left innominate vein. No pneumothorax. IMPRESSION: Mild thoracic dextroscoliosis. Persistent right basilar atelectasis or scarring. Old right lower rib fracture. No infiltrate or pulmonary edema. Electronically Signed   By: Natasha Mead M.D.   On: 08/25/2016 09:18   Dg Chest Port 1 View  Result Date: 08/24/2016 CLINICAL DATA:  Acute myocardial infarction with acute systolic heart failure. EXAM: PORTABLE CHEST 1 VIEW COMPARISON:  08/23/2016 and 08/19/2016 FINDINGS: Intra-aortic balloon pump in place. Swan-Ganz catheter tip is in the right pulmonary artery. Pulmonary vascularity is normal. Minimal residual atelectasis at the right base. Left base has cleared. IMPRESSION: Improving aeration of the left lung base. Minimal right base atelectasis. Electronically Signed   By: Francene Boyers M.D.   On: 08/24/2016 07:41   Dg Chest Port 1 View  Result Date: 08/23/2016 CLINICAL DATA:  On intra-aortic balloon pump assist EXAM: PORTABLE CHEST 1 VIEW COMPARISON:  08/19/2016 FINDINGS: IABP marker projected over the aortic arch. Swan-Ganz catheter inserted via inferior approach with tip over the right hilum. Shallow inspiration with elevation of the right hemidiaphragm. Atelectasis in the lung bases previous right lung opacity has improved since prior study suggesting that it probably represented pneumonia or atelectasis. Small right pleural effusion. Old right rib fracture. No pneumothorax. Aortic calcification. IMPRESSION: Shallow inspiration. Atelectasis in the lung bases. Improving infiltration or atelectasis in the right lung base since previous study. Small right pleural effusion. Appliances appear in satisfactory position. Electronically Signed   By: Burman Nieves M.D.   On: 08/23/2016 21:30     Diagnostic Studies/Procedures     Cardiac Catheterization: 09/08/2016  Prox LAD to Mid LAD lesion, 65 %stenosed. Dist LAD lesion, 65 %stenosed. - Plan is Medical managment for  now  Widely patent LM Stent: 10 %stenosed (similar to Post PCI)  Mid RCA lesion, 80 %stenosed.  A STENT PROMUS PREM MR 3.0X16 drug eluting stent was successfully placed.  Post intervention, there is a 0% residual stenosis.  Prox RCA lesion, 90 %stenosed.  A STENT PROMUS PREM MR 3.0X12 drug eluting stent was successfully placed.  Post intervention, there is a 0% residual stenosis.  There is moderate to severe left ventricular systolic dysfunction. EF ~30-35% - notably improved from 20-25%   Successful 2 site PCI to the RCA with DES stents. Widely patent left main and LAD stent with stable extensive disease in the mid LAD that would require significant stent, and plan is medical management.  Plan:  Transferred to 6 Central posterior unit for TR band removal.  Restart Eliquis tonight. Plan would be to complete a 3 month course of Eliquis for DVT along with aspirin and Plavix.  Would continue Plavix indefinitely, and preferably continue aspirin for the first year.  Monitor for anemia.   Echocardiogram: 09/09/2016 Study Conclusions  - Left ventricle: Septal and apical hypokinesis The cavity size was   severely dilated. Wall thickness was increased in a pattern of   severe LVH. Systolic function was mildly to moderately reduced.   The estimated ejection fraction was in the range of 40% to 45%.   The study is not technically sufficient to allow evaluation of LV   diastolic function. -  Aortic valve: There was mild to moderate stenosis. There was mild   regurgitation. Valve area (VTI): 1.21 cm^2. Valve area (Vmax):   1.21 cm^2. Valve area (Vmean): 1.15 cm^2. - Mitral valve: Eccentric MR with possible prolapse of anterior   segment. Consider TEE to further evaluate mechanism and degree of   MR. There was moderate regurgitation. - Left atrium: The atrium was severely dilated. - Atrial septum: No defect or patent foramen ovale was identified.    Disposition   Pt is being  discharged home today in good condition.  Follow-up Plans & Appointments     Contact information for follow-up providers    Jacolyn Reedy, PA-C Follow up on 09/21/2016.   Specialty:  Cardiology Why:  [facility] Follow-up on 09/21/2016 at 1:40PM.  Contact information: [address]  [place] [address] [phone]            Contact information for after-discharge care    Destination    [facility] .   Specialty:  Skilled Nursing Facility Contact information: [address] [place] [address] [phone]                 Discharge Instructions    Amb Referral to Cardiac Rehabilitation    Complete by:  As directed    Diagnosis:  Coronary Stents   Diet - low sodium heart healthy    Complete by:  As directed    Discharge instructions    Complete by:  As directed    PLEASE REMEMBER TO BRING ALL OF YOUR MEDICATIONS TO EACH OF YOUR FOLLOW-UP OFFICE VISITS.  PLEASE ATTEND ALL SCHEDULED FOLLOW-UP APPOINTMENTS.   Activity: Increase activity slowly as tolerated. You may shower, but no soaking baths (or swimming) for 1 week. No driving for 24 hours. No lifting over 5 lbs for 1 week. No sexual activity for 1 week.   You May Return to Work: in 1 week (if applicable)  Wound Care: You may wash cath site gently with soap and water. Keep cath site clean and dry. If you notice pain, swelling, bleeding or pus at your cath site, please call [phone].   Increase activity slowly    Complete by:  As directed       Discharge Medications     Medication List    STOP taking these medications   lisinopril 5 MG tablet Commonly known as:  PRINIVIL,ZESTRIL   oxyCODONE-acetaminophen 5-325 MG tablet Commonly known as:  PERCOCET/ROXICET     TAKE these medications   acetaminophen 500 MG tablet Commonly known as:  TYLENOL Take 1 tablet (500 mg total) by mouth every 6 (six) hours as needed. What changed:  how much to  take  reasons to take this   apixaban 5 MG Tabs tablet Commonly known as:  ELIQUIS Take 1 tablet (5 mg total) by mouth 2 (two) times daily.   aspirin EC 81 MG tablet Take 81 mg by mouth daily.   atorvastatin 40 MG tablet Commonly known as:  LIPITOR Take 1 tablet (40 mg total) by mouth daily at 6 PM.   clopidogrel 75 MG tablet Commonly known as:  PLAVIX Take 1 tablet (75 mg total) by mouth daily with breakfast.   ferrous sulfate 325 (65 FE) MG tablet Take 1 tablet (325 mg total) by mouth daily with breakfast.   finasteride 5 MG tablet Commonly known as:  PROSCAR Take 1 tablet by mouth daily.   metoprolol tartrate 25 MG tablet Commonly known as:  LOPRESSOR Take 0.5 tablets (12.5 mg total) by mouth 2 (two) times daily. What changed:  how much to take   nitroGLYCERIN 0.4 MG SL tablet Commonly known as:  NITROSTAT Place 1 tablet (0.4 mg total) under the tongue every 5 (five) minutes x 3 doses as needed for chest pain.   pantoprazole 40 MG tablet Commonly known as:  PROTONIX Take 1 tablet (40 mg total) by mouth daily.   polyethylene glycol packet Commonly known as:  MIRALAX / GLYCOLAX Take 17 g by mouth daily. Start taking on:  09/14/2016   senna-docusate 8.6-50 MG tablet Commonly known as:  Senokot-S Take 1 tablet by mouth at bedtime.   tamsulosin 0.4 MG Caps capsule Commonly known as:  FLOMAX Take 2 capsules (0.8 mg total) by mouth daily after supper.       Aspirin prescribed at discharge?  Yes High Intensity Statin Prescribed? (Lipitor 40-80mg  or Crestor 20-40mg ): Yes Beta Blocker Prescribed? Yes For EF 40% or less, Was ACEI/ARB Prescribed? No: Elevated Creatinine ADP Receptor Inhibitor Prescribed? (i.e. Plavix etc.-Includes Medically Managed Patients): Yes For EF <40%, Aldosterone Inhibitor Prescribed? No: Elevated Creatinine. EF is 40-45% Was EF assessed during THIS hospitalization? Yes - Cath and Echo Was Cardiac Rehab II ordered? (Included Medically  managed Patients): No - Going to SNF at time of discharge.   Allergies No Known Allergies   Outstanding Labs/Studies   CBC and BMET at follow-up appointment.  Duration of Discharge Encounter   Greater than 30 minutes including physician time.  Signed, Ellsworth LennoxBrittany M Airelle Everding, PA-C 09/13/2016, 11:45 AM

## 2016-09-13 NOTE — Progress Notes (Signed)
Called report to [facility] @1249 , [facility] D/c patients telemetry. CCMD notified. Removed LFA IV, waiting for Petar arrival.  Minerva Endsiffany N Mingo Siegert

## 2016-09-15 ENCOUNTER — Non-Acute Institutional Stay (SKILLED_NURSING_FACILITY): Payer: Medicare Other | Admitting: Internal Medicine

## 2016-09-15 ENCOUNTER — Encounter: Payer: Self-pay | Admitting: Internal Medicine

## 2016-09-15 DIAGNOSIS — D649 Anemia, unspecified: Secondary | ICD-10-CM | POA: Diagnosis not present

## 2016-09-15 DIAGNOSIS — Z959 Presence of cardiac and vascular implant and graft, unspecified: Secondary | ICD-10-CM

## 2016-09-15 DIAGNOSIS — I2510 Atherosclerotic heart disease of native coronary artery without angina pectoris: Secondary | ICD-10-CM

## 2016-09-15 DIAGNOSIS — I825Z2 Chronic embolism and thrombosis of unspecified deep veins of left distal lower extremity: Secondary | ICD-10-CM

## 2016-09-15 DIAGNOSIS — I951 Orthostatic hypotension: Secondary | ICD-10-CM | POA: Diagnosis not present

## 2016-09-15 DIAGNOSIS — Z95820 Peripheral vascular angioplasty status with implants and grafts: Secondary | ICD-10-CM
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
## 2016-09-19 ENCOUNTER — Encounter (HOSPITAL_COMMUNITY)
Admission: RE | Admit: 2016-09-19 | Discharge: 2016-09-19 | Disposition: A | Discharge status: Home / Self Care | Payer: Medicare Other | Source: Skilled Nursing Facility | Attending: Internal Medicine | Admitting: Internal Medicine

## 2016-09-19 LAB — BASIC METABOLIC PANEL
Anion gap: 7 (ref 5–15)
BUN: 24 mg/dL — AB (ref 6–20)
CHLORIDE: 105 mmol/L (ref 101–111)
CO2: 27 mmol/L (ref 22–32)
Calcium: 8.5 mg/dL — ABNORMAL LOW (ref 8.9–10.3)
Creatinine, Ser: 1.49 mg/dL — ABNORMAL HIGH (ref 0.61–1.24)
GFR calc Af Amer: 47 mL/min — ABNORMAL LOW (ref >60)
GFR calc non Af Amer: 41 mL/min — ABNORMAL LOW (ref >60)
Glucose, Bld: 128 mg/dL — ABNORMAL HIGH (ref 65–99)
POTASSIUM: 4.1 mmol/L (ref 3.5–5.1)
SODIUM: 139 mmol/L (ref 135–145)

## 2016-09-19 LAB — CBC WITH DIFFERENTIAL/PLATELET
Basophils Absolute: 0 10*3/uL (ref 0.0–0.1)
Basophils Relative: 1 %
EOS ABS: 0.6 10*3/uL (ref 0.0–0.7)
Eosinophils Relative: 10 %
HEMATOCRIT: 30.1 % — AB (ref 39.0–52.0)
HEMOGLOBIN: 9.5 g/dL — AB (ref 13.0–17.0)
LYMPHS ABS: 1.8 10*3/uL (ref 0.7–4.0)
LYMPHS PCT: 32 %
MCH: 29.5 pg (ref 26.0–34.0)
MCHC: 31.6 g/dL (ref 30.0–36.0)
MCV: 93.5 fL (ref 78.0–100.0)
Monocytes Absolute: 0.3 10*3/uL (ref 0.1–1.0)
Monocytes Relative: 6 %
NEUTROS PCT: 51 %
Neutro Abs: 2.8 10*3/uL (ref 1.7–7.7)
Platelets: 258 10*3/uL (ref 150–400)
RBC: 3.22 MIL/uL — AB (ref 4.22–5.81)
RDW: 15 % (ref 11.5–15.5)
WBC: 5.5 10*3/uL (ref 4.0–10.5)

## 2016-09-19 LAB — TSH: TSH: 3.905 u[IU]/mL (ref 0.350–4.500)

## 2016-09-21 ENCOUNTER — Ambulatory Visit (INDEPENDENT_AMBULATORY_CARE_PROVIDER_SITE_OTHER): Payer: Medicare Other | Admitting: Physician Assistant

## 2016-09-21 ENCOUNTER — Non-Acute Institutional Stay (SKILLED_NURSING_FACILITY): Payer: Medicare Other | Admitting: Internal Medicine

## 2016-09-21 ENCOUNTER — Encounter: Payer: Self-pay | Admitting: Physician Assistant

## 2016-09-21 ENCOUNTER — Encounter: Payer: Self-pay | Admitting: Internal Medicine

## 2016-09-21 VITALS — BP 124/56 | HR 59 | Ht 73.0 in | Wt 206.0 lb

## 2016-09-21 DIAGNOSIS — I82409 Acute embolism and thrombosis of unspecified deep veins of unspecified lower extremity: Secondary | ICD-10-CM

## 2016-09-21 DIAGNOSIS — I10 Essential (primary) hypertension: Secondary | ICD-10-CM | POA: Diagnosis not present

## 2016-09-21 DIAGNOSIS — I951 Orthostatic hypotension: Secondary | ICD-10-CM

## 2016-09-21 DIAGNOSIS — I2510 Atherosclerotic heart disease of native coronary artery without angina pectoris: Secondary | ICD-10-CM

## 2016-09-21 DIAGNOSIS — N289 Disorder of kidney and ureter, unspecified: Secondary | ICD-10-CM

## 2016-09-21 DIAGNOSIS — D649 Anemia, unspecified: Secondary | ICD-10-CM

## 2016-09-21 DIAGNOSIS — I420 Dilated cardiomyopathy: Secondary | ICD-10-CM | POA: Diagnosis not present

## 2016-09-21 DIAGNOSIS — E039 Hypothyroidism, unspecified: Secondary | ICD-10-CM | POA: Diagnosis not present

## 2016-09-21 DIAGNOSIS — E7800 Pure hypercholesterolemia, unspecified: Secondary | ICD-10-CM
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
## 2016-09-21 NOTE — Patient Instructions (Signed)
Your physician recommends that you schedule a follow-up appointment in: 6 Weeks with Dr. Diona [facility]owell [facility]  Your physician recommends that you continue on your current medications as directed. Please refer to the Current Medication list given to you today.  Your physician recommends that you return for lab work in: Fasting  If you need a refill on your cardiac medications before your next appointment, please call your pharmacy.  Thank you for choosing [facility]!

## 2016-09-22 ENCOUNTER — Encounter (HOSPITAL_COMMUNITY)
Admission: AD | Admit: 2016-09-22 | Discharge: 2016-09-22 | Disposition: A | Discharge status: Home / Self Care | Payer: Medicare Other | Source: Skilled Nursing Facility | Attending: *Deleted | Admitting: *Deleted

## 2016-09-22 LAB — LIPID PANEL
Cholesterol: 78 mg/dL (ref 0–200)
HDL: 22 mg/dL — ABNORMAL LOW (ref >40)
LDL Cholesterol: 37 mg/dL (ref 0–99)
Total CHOL/HDL Ratio: 3.5 RATIO
Triglycerides: 96 mg/dL (ref <150)
VLDL: 19 mg/dL (ref 0–40)

## 2016-10-07 ENCOUNTER — Encounter: Payer: Self-pay | Admitting: Internal Medicine

## 2016-10-07 ENCOUNTER — Non-Acute Institutional Stay (SKILLED_NURSING_FACILITY): Payer: Medicare Other | Admitting: Internal Medicine

## 2016-10-07 ENCOUNTER — Encounter (HOSPITAL_COMMUNITY)
Admission: AD | Admit: 2016-10-07 | Discharge: 2016-10-07 | Disposition: A | Discharge status: Home / Self Care | Payer: Medicare Other | Source: Skilled Nursing Facility | Attending: Internal Medicine | Admitting: Internal Medicine

## 2016-10-07 DIAGNOSIS — I2510 Atherosclerotic heart disease of native coronary artery without angina pectoris: Secondary | ICD-10-CM

## 2016-10-07 DIAGNOSIS — I10 Essential (primary) hypertension: Secondary | ICD-10-CM

## 2016-10-07 DIAGNOSIS — D649 Anemia, unspecified: Secondary | ICD-10-CM

## 2016-10-07 DIAGNOSIS — I82409 Acute embolism and thrombosis of unspecified deep veins of unspecified lower extremity: Secondary | ICD-10-CM

## 2016-10-07 DIAGNOSIS — I5042 Chronic combined systolic (congestive) and diastolic (congestive) heart failure: Secondary | ICD-10-CM | POA: Insufficient documentation

## 2016-10-07 DIAGNOSIS — N289 Disorder of kidney and ureter, unspecified: Secondary | ICD-10-CM | POA: Diagnosis not present

## 2016-10-07 LAB — BASIC METABOLIC PANEL
Anion gap: 7 (ref 5–15)
BUN: 30 mg/dL — AB (ref 6–20)
CHLORIDE: 108 mmol/L (ref 101–111)
CO2: 23 mmol/L (ref 22–32)
Calcium: 8.6 mg/dL — ABNORMAL LOW (ref 8.9–10.3)
Creatinine, Ser: 1.53 mg/dL — ABNORMAL HIGH (ref 0.61–1.24)
GFR calc non Af Amer: 39 mL/min — ABNORMAL LOW (ref >60)
GFR, EST AFRICAN AMERICAN: 46 mL/min — AB (ref >60)
Glucose, Bld: 185 mg/dL — ABNORMAL HIGH (ref 65–99)
POTASSIUM: 4.7 mmol/L (ref 3.5–5.1)
SODIUM: 138 mmol/L (ref 135–145)

## 2016-10-07 LAB — CBC WITH DIFFERENTIAL/PLATELET
Basophils Absolute: 0 10*3/uL (ref 0.0–0.1)
Basophils Relative: 1 %
Eosinophils Absolute: 0.4 10*3/uL (ref 0.0–0.7)
Eosinophils Relative: 7 %
HEMATOCRIT: 34.6 % — AB (ref 39.0–52.0)
HEMOGLOBIN: 10.8 g/dL — AB (ref 13.0–17.0)
LYMPHS ABS: 1.2 10*3/uL (ref 0.7–4.0)
Lymphocytes Relative: 21 %
MCH: 29.2 pg (ref 26.0–34.0)
MCHC: 31.2 g/dL (ref 30.0–36.0)
MCV: 93.5 fL (ref 78.0–100.0)
MONOS PCT: 8 %
Monocytes Absolute: 0.5 10*3/uL (ref 0.1–1.0)
NEUTROS ABS: 3.7 10*3/uL (ref 1.7–7.7)
NEUTROS PCT: 63 %
Platelets: 181 10*3/uL (ref 150–400)
RBC: 3.7 MIL/uL — AB (ref 4.22–5.81)
RDW: 14.9 % (ref 11.5–15.5)
WBC: 5.8 10*3/uL (ref 4.0–10.5)
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
## 2016-10-11 ENCOUNTER — Encounter: Payer: Self-pay | Admitting: Vascular Surgery

## 2016-10-12 ENCOUNTER — Ambulatory Visit (HOSPITAL_COMMUNITY)
Admission: RE | Admit: 2016-10-12 | Discharge: 2016-10-12 | Disposition: A | Discharge status: Home / Self Care | Payer: Medicare Other | Source: Ambulatory Visit | Attending: Vascular Surgery | Admitting: Vascular Surgery

## 2016-10-12 ENCOUNTER — Encounter: Payer: Self-pay | Admitting: Vascular Surgery

## 2016-10-12 ENCOUNTER — Ambulatory Visit (INDEPENDENT_AMBULATORY_CARE_PROVIDER_SITE_OTHER): Payer: Medicare Other | Admitting: Vascular Surgery

## 2016-10-12 VITALS — BP 133/58 | HR 51 | Temp 97.3°F | Resp 18 | Ht 73.0 in | Wt 212.7 lb

## 2016-10-12 DIAGNOSIS — Z86718 Personal history of other venous thrombosis and embolism: Secondary | ICD-10-CM | POA: Diagnosis not present

## 2016-10-12 DIAGNOSIS — I872 Venous insufficiency (chronic) (peripheral): Secondary | ICD-10-CM | POA: Diagnosis not present

## 2016-10-12 DIAGNOSIS — I82402 Acute embolism and thrombosis of unspecified deep veins of left lower extremity: Secondary | ICD-10-CM

## 2016-10-12 NOTE — Progress Notes (Signed)
Patient name: Tim GratesJohn A Selkirk Jr. MRN: [id number] DOB: [birth year] Sex: male  REASON FOR VISIT: Follow up of superficial thrombophlebitis and calf vein DVT.  HPI: Tim GratesJohn A Bernasconi Jr. is a 82 y.o. male who I saw as a consult in the hospital with left calf pain. He was hospitalized with a non-ST elevation MI and has a history of ischemic cardiomyopathy and heart failure. During the admission he was having left calf pain and a duplex scan showed clot involving the paired peroneal veins in the left calf and also superficial thrombophlebitis involving the left small saphenous vein. He was set up for an outpatient visit. I had recommended anticoagulation for 6 weeks with a follow up study at that time.  Since I saw him last his left calf pain has improved significantly. He continues to have swelling in the left calf.  Current Outpatient Prescriptions  Medication Sig Dispense Refill  . acetaminophen (TYLENOL) 500 MG tablet Take 1 tablet (500 mg total) by mouth every 6 (six) hours as needed. 30 tablet 0  . apixaban (ELIQUIS) 5 MG TABS tablet Take 1 tablet (5 mg total) by mouth 2 (two) times daily.    Marland Kitchen. aspirin EC 81 MG tablet Take 81 mg by mouth daily.    Marland Kitchen. atorvastatin (LIPITOR) 40 MG tablet Take 1 tablet (40 mg total) by mouth daily at 6 PM. 30 tablet 0  . clopidogrel (PLAVIX) 75 MG tablet Take 1 tablet (75 mg total) by mouth daily with breakfast. 30 tablet 0  . ferrous sulfate 325 (65 FE) MG tablet Take 1 tablet (325 mg total) by mouth daily with breakfast. 30 tablet 0  . metoprolol tartrate (LOPRESSOR) 25 MG tablet Take 12.5 mg by mouth 2 (two) times daily.     . nitroGLYCERIN (NITROSTAT) 0.4 MG SL tablet Place 1 tablet (0.4 mg total) under the tongue every 5 (five) minutes x 3 doses as needed for chest pain. 25 tablet 3  . pantoprazole (PROTONIX) 40 MG tablet Take 1 tablet (40 mg total) by mouth daily. 30 tablet 0  . polyethylene glycol (MIRALAX / GLYCOLAX) packet Take 17 g by mouth daily. 14 each 0  .  senna-docusate (SENOKOT-S) 8.6-50 MG tablet Take 1 tablet by mouth at bedtime. 30 tablet 0   No current facility-administered medications for this visit.     REVIEW OF SYSTEMS:  [X]  denotes positive finding, [ ]  denotes negative finding Cardiac  Comments:  Chest pain or chest pressure: X   Shortness of breath upon exertion: X   Short of breath when lying flat:    Irregular heart rhythm: X   Constitutional    Fever or chills:      PHYSICAL EXAM: Vitals:   10/12/16 1211  BP: (!) 133/58  Pulse: (!) 51  Resp: 18  Temp: 97.3 F (36.3 C)  TempSrc: Oral  SpO2: 99%  Weight: 212 lb 11.2 oz (96.5 kg)  Height: 6\' 1"  (1.854 m)    GENERAL: The patient is a well-nourished male, in no acute distress. The vital signs are documented above. CARDIOVASCULAR: There is a regular rate and rhythm. PULMONARY: There is good air exchange bilaterally without wheezing or rales.  LEFT LOWER EXTREMITY VENOUS DUPLEX: I have independently interpreted his left lower extremity venous duplex can today. There is no evidence of acute DVT or superficial venous thrombosis in the left lower extremity. The thrombus in the short saphenous vein appears to have resolved and there appears to be reconstitution of flow in the  peroneal veins.  MEDICAL ISSUES:  LEFT CALF DVT AND SUPERFICIAL THROMBOPHLEBITIS: His duplex scan today shows resolution of the superficial thrombophlebitis in the left small saphenous vein. In addition there appears to be recanalization of the peroneal veins which had clot before. For this reason I think it is safe to stop his Eliquis. We did discuss the importance of intermittent leg elevation. In addition he will continue to wear his compression stockings. I will see him back as needed.  Waverly Ferrariickson, Floreine Kingdon [facility] [phone]

## 2016-10-24 ENCOUNTER — Emergency Department (HOSPITAL_COMMUNITY)
Admission: EM | Admit: 2016-10-24 | Discharge: 2016-10-24 | Disposition: A | Discharge status: Home / Self Care | Payer: Medicare Other | Attending: Emergency Medicine | Admitting: Emergency Medicine

## 2016-10-24 ENCOUNTER — Encounter (HOSPITAL_COMMUNITY): Payer: Self-pay | Admitting: *Deleted

## 2016-10-24 ENCOUNTER — Emergency Department (HOSPITAL_COMMUNITY): Payer: Medicare Other

## 2016-10-24 DIAGNOSIS — Z87891 Personal history of nicotine dependence: Secondary | ICD-10-CM | POA: Diagnosis not present

## 2016-10-24 DIAGNOSIS — M546 Pain in thoracic spine: Secondary | ICD-10-CM | POA: Diagnosis present

## 2016-10-24 DIAGNOSIS — I5021 Acute systolic (congestive) heart failure: Secondary | ICD-10-CM | POA: Diagnosis not present

## 2016-10-24 DIAGNOSIS — E119 Type 2 diabetes mellitus without complications: Secondary | ICD-10-CM | POA: Diagnosis not present

## 2016-10-24 DIAGNOSIS — I2510 Atherosclerotic heart disease of native coronary artery without angina pectoris: Secondary | ICD-10-CM | POA: Diagnosis not present

## 2016-10-24 DIAGNOSIS — Z79899 Other long term (current) drug therapy: Secondary | ICD-10-CM | POA: Insufficient documentation

## 2016-10-24 DIAGNOSIS — I110 Hypertensive heart disease with heart failure: Secondary | ICD-10-CM | POA: Insufficient documentation

## 2016-10-24 DIAGNOSIS — Z7982 Long term (current) use of aspirin: Secondary | ICD-10-CM | POA: Diagnosis not present

## 2016-10-24 LAB — CBC WITH DIFFERENTIAL/PLATELET
BASOS PCT: 0 %
Basophils Absolute: 0 10*3/uL (ref 0.0–0.1)
Eosinophils Absolute: 0.3 10*3/uL (ref 0.0–0.7)
Eosinophils Relative: 4 %
HEMATOCRIT: 31.4 % — AB (ref 39.0–52.0)
Hemoglobin: 10.1 g/dL — ABNORMAL LOW (ref 13.0–17.0)
LYMPHS ABS: 1 10*3/uL (ref 0.7–4.0)
LYMPHS PCT: 13 %
MCH: 29.3 pg (ref 26.0–34.0)
MCHC: 32.2 g/dL (ref 30.0–36.0)
MCV: 91 fL (ref 78.0–100.0)
MONO ABS: 0.6 10*3/uL (ref 0.1–1.0)
MONOS PCT: 7 %
NEUTROS ABS: 5.8 10*3/uL (ref 1.7–7.7)
NEUTROS PCT: 76 %
Platelets: 159 10*3/uL (ref 150–400)
RBC: 3.45 MIL/uL — ABNORMAL LOW (ref 4.22–5.81)
RDW: 14.7 % (ref 11.5–15.5)
WBC: 7.7 10*3/uL (ref 4.0–10.5)

## 2016-10-24 LAB — BASIC METABOLIC PANEL
ANION GAP: 6 (ref 5–15)
BUN: 21 mg/dL — AB (ref 6–20)
CHLORIDE: 107 mmol/L (ref 101–111)
CO2: 26 mmol/L (ref 22–32)
Calcium: 8.5 mg/dL — ABNORMAL LOW (ref 8.9–10.3)
Creatinine, Ser: 1.4 mg/dL — ABNORMAL HIGH (ref 0.61–1.24)
GFR calc Af Amer: 51 mL/min — ABNORMAL LOW (ref >60)
GFR calc non Af Amer: 44 mL/min — ABNORMAL LOW (ref >60)
Glucose, Bld: 124 mg/dL — ABNORMAL HIGH (ref 65–99)
POTASSIUM: 3.7 mmol/L (ref 3.5–5.1)
SODIUM: 139 mmol/L (ref 135–145)

## 2016-10-24 LAB — TROPONIN I: TROPONIN I: 0.04 ng/mL — AB (ref <0.03)

## 2016-10-24 MED ORDER — HYDROCODONE-ACETAMINOPHEN 5-325 MG PO TABS: 1.0000 | ORAL_TABLET | ORAL | 0 refills | Status: AC | PRN

## 2016-10-24 MED ORDER — FENTANYL CITRATE (PF) 100 MCG/2ML IJ SOLN: 50.0000 ug | Freq: Once | INTRAMUSCULAR | Status: DC

## 2016-10-24 MED ORDER — SODIUM CHLORIDE 0.9 % IV BOLUS (SEPSIS)
500.0000 mL | Freq: Once | INTRAVENOUS | Status: AC
  Administered 2016-10-24: 500 mL via INTRAVENOUS

## 2016-10-24 MED ORDER — FENTANYL CITRATE (PF) 100 MCG/2ML IJ SOLN
50.0000 ug | Freq: Once | INTRAMUSCULAR | Status: AC
Start: 2016-10-24 — End: 2016-10-24
  Administered 2016-10-24: 50 ug via INTRAVENOUS
  Filled 2016-10-24: qty 2

## 2016-10-24 MED ORDER — FENTANYL CITRATE (PF) 100 MCG/2ML IJ SOLN
50.0000 ug | Freq: Once | INTRAMUSCULAR | Status: AC
  Administered 2016-10-24: 50 ug via INTRAVENOUS
  Filled 2016-10-24: qty 2

## 2016-10-24 NOTE — ED Notes (Signed)
CRITICAL VALUE ALERT  Critical value received:  Troponin 0.04  Date of notification:  10/24/2016  Time of notification:  1133  Critical value read back:Yes.    Nurse who received alert:  [facility] RN  MD notified (1st page): Dr. Adriana Simasook  Time of first page:  1133  MD notified (2nd page):  Time of second page:  Responding MD:  Dr. Adriana Simasook  Time MD responded:  [phone]

## 2016-10-24 NOTE — ED Notes (Signed)
To x-ray

## 2016-10-24 NOTE — Discharge Instructions (Signed)
Meds for pain;   Follow up your dr or return if worse.
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
## 2016-10-24 NOTE — ED Notes (Signed)
Patient states his pain is increasing. Dr. Adriana Simasook made aware.

## 2016-10-24 NOTE — ED Triage Notes (Signed)
Pt comes in by EMS for lower to mid back pain starting around 6:00 when he woke up. Pt denies any injury. Pt is alert and oriented with NAD noted.

## 2016-11-17 ENCOUNTER — Encounter: Payer: Self-pay | Admitting: Cardiology
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
## 2016-11-18 ENCOUNTER — Ambulatory Visit (INDEPENDENT_AMBULATORY_CARE_PROVIDER_SITE_OTHER): Payer: Medicare Other | Admitting: Cardiology

## 2016-11-18 ENCOUNTER — Encounter: Payer: Self-pay | Admitting: Cardiology

## 2016-11-18 VITALS — BP 135/65 | HR 57 | Ht 73.0 in | Wt 203.0 lb

## 2016-11-18 DIAGNOSIS — I38 Endocarditis, valve unspecified: Secondary | ICD-10-CM | POA: Diagnosis not present

## 2016-11-18 DIAGNOSIS — Z86718 Personal history of other venous thrombosis and embolism: Secondary | ICD-10-CM | POA: Diagnosis not present

## 2016-11-18 DIAGNOSIS — I255 Ischemic cardiomyopathy: Secondary | ICD-10-CM

## 2016-11-18 DIAGNOSIS — I2510 Atherosclerotic heart disease of native coronary artery without angina pectoris: Secondary | ICD-10-CM | POA: Diagnosis not present

## 2016-11-18 NOTE — Patient Instructions (Signed)

## 2017-02-02 DEATH — deceased

## 2017-03-30 IMAGING — DX DG CHEST 2V
2 series · 2 of 2 positions shown · non-contrast
Comparison: 08/25/2016

CLINICAL DATA: Chest pain for 3 days

EXAM:
CHEST  2 VIEW

[x chest ap]
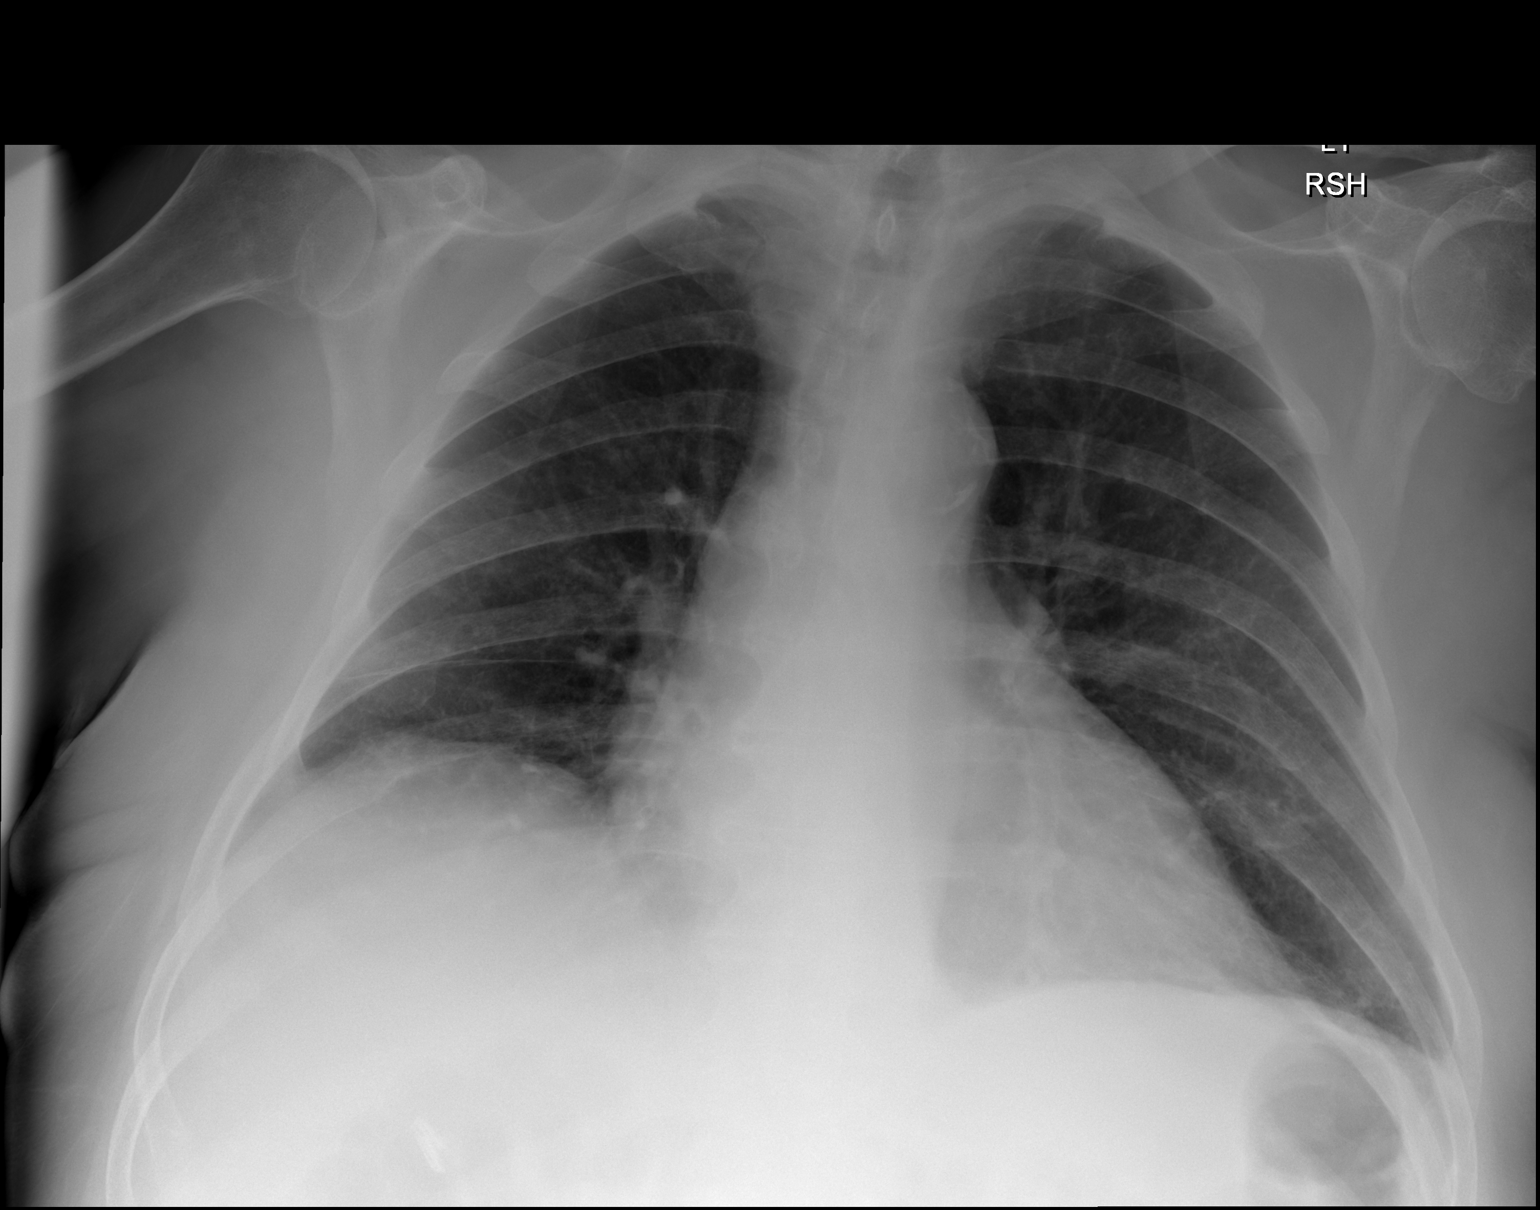

[w chest lat]
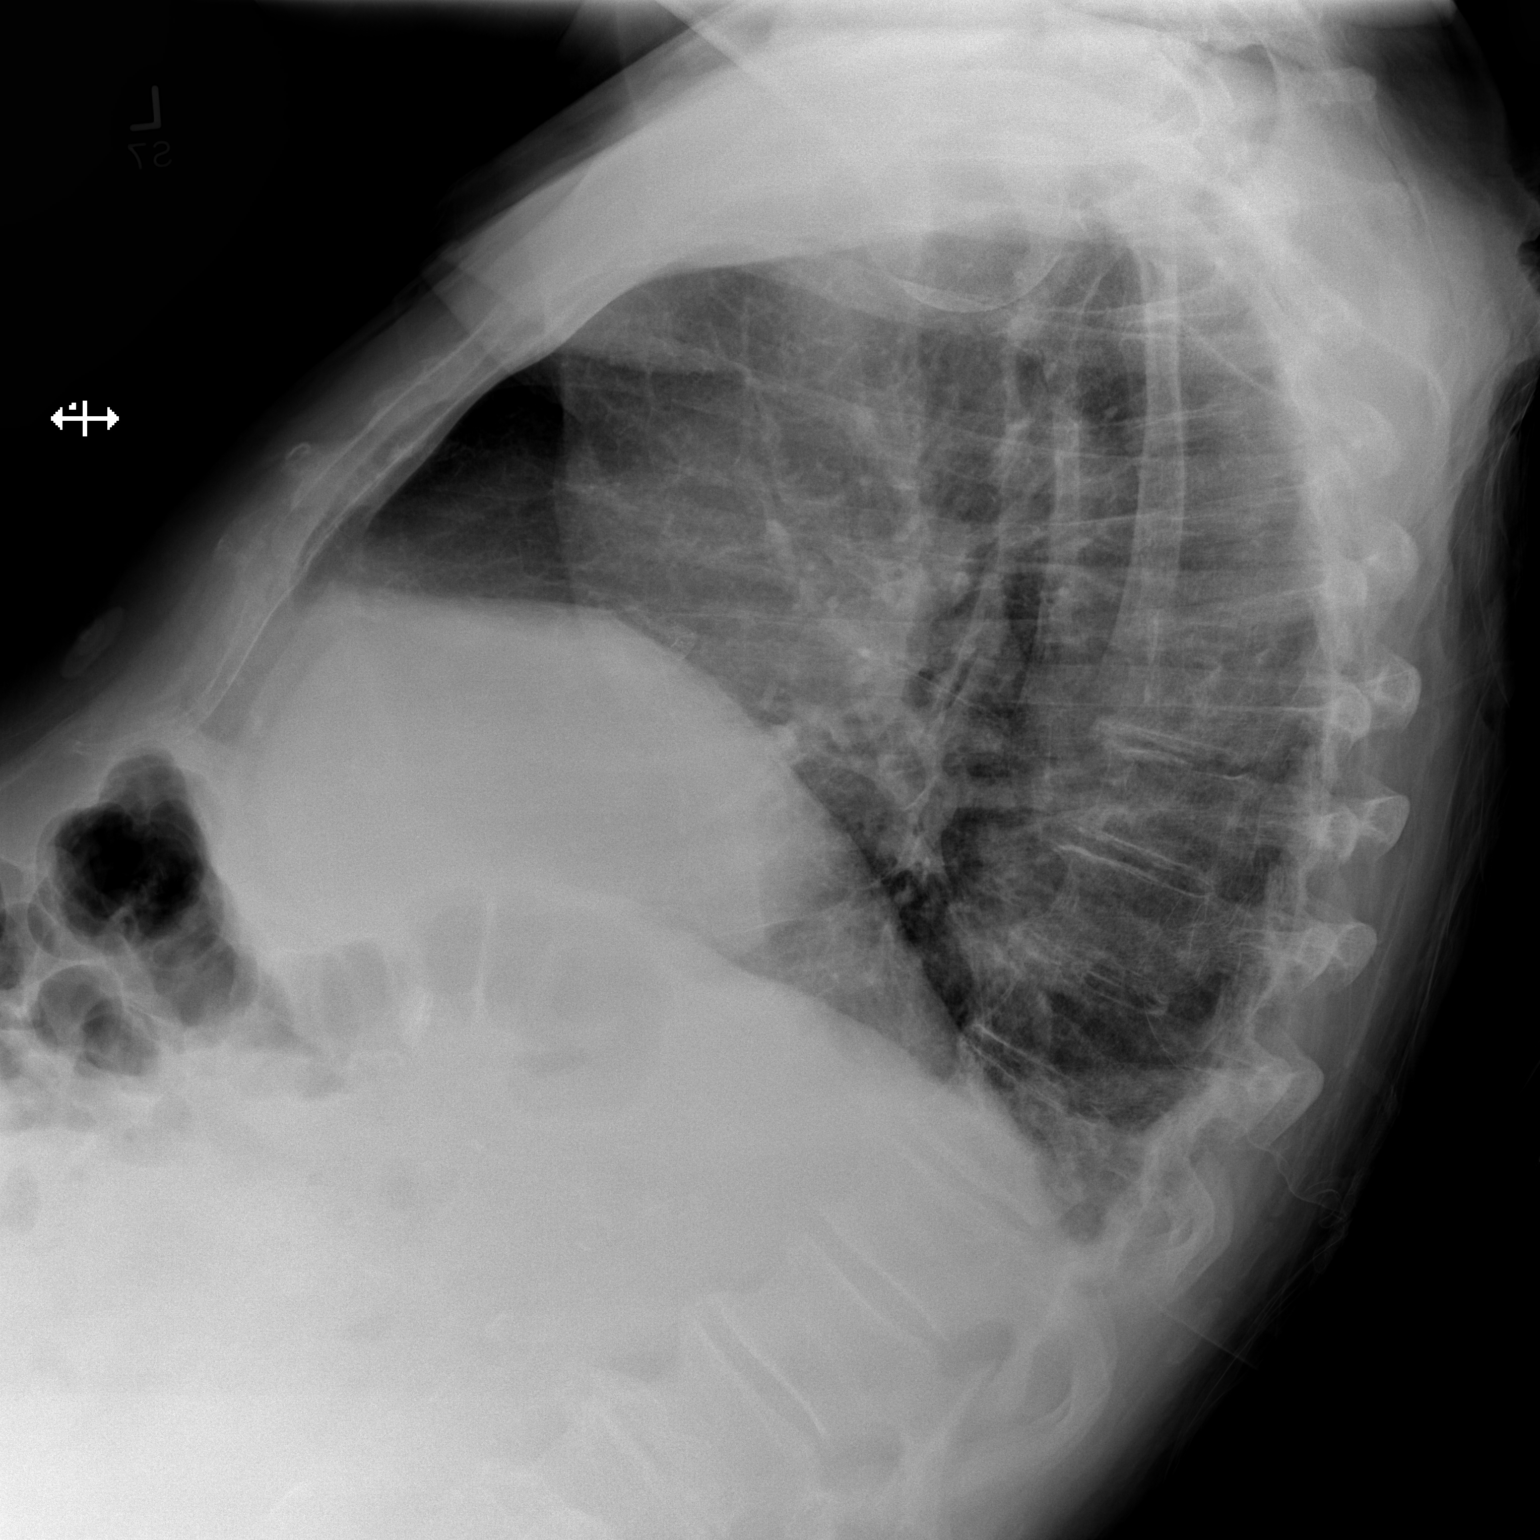

[2 of 2 positions shown; findings below may reference images not displayed]

FINDINGS: Normal heart size. Low volumes. Bibasilar atelectasis. No
pneumothorax. Tiny pleural effusions are suspected with subtle
blunting of the posterior costophrenic angles. No pneumothorax. No
pleural effusion.
IMPRESSION: Bibasilar atelectasis.  Tiny pleural effusions are suspected.

## 2017-05-17 IMAGING — DX DG CHEST 2V
2 series · 2 of 2 positions shown · non-contrast
Comparison: 09/06/2006 to

CLINICAL DATA: Mid back pain today. History coronary artery
disease.

EXAM:
CHEST  2 VIEW

[chest lat]
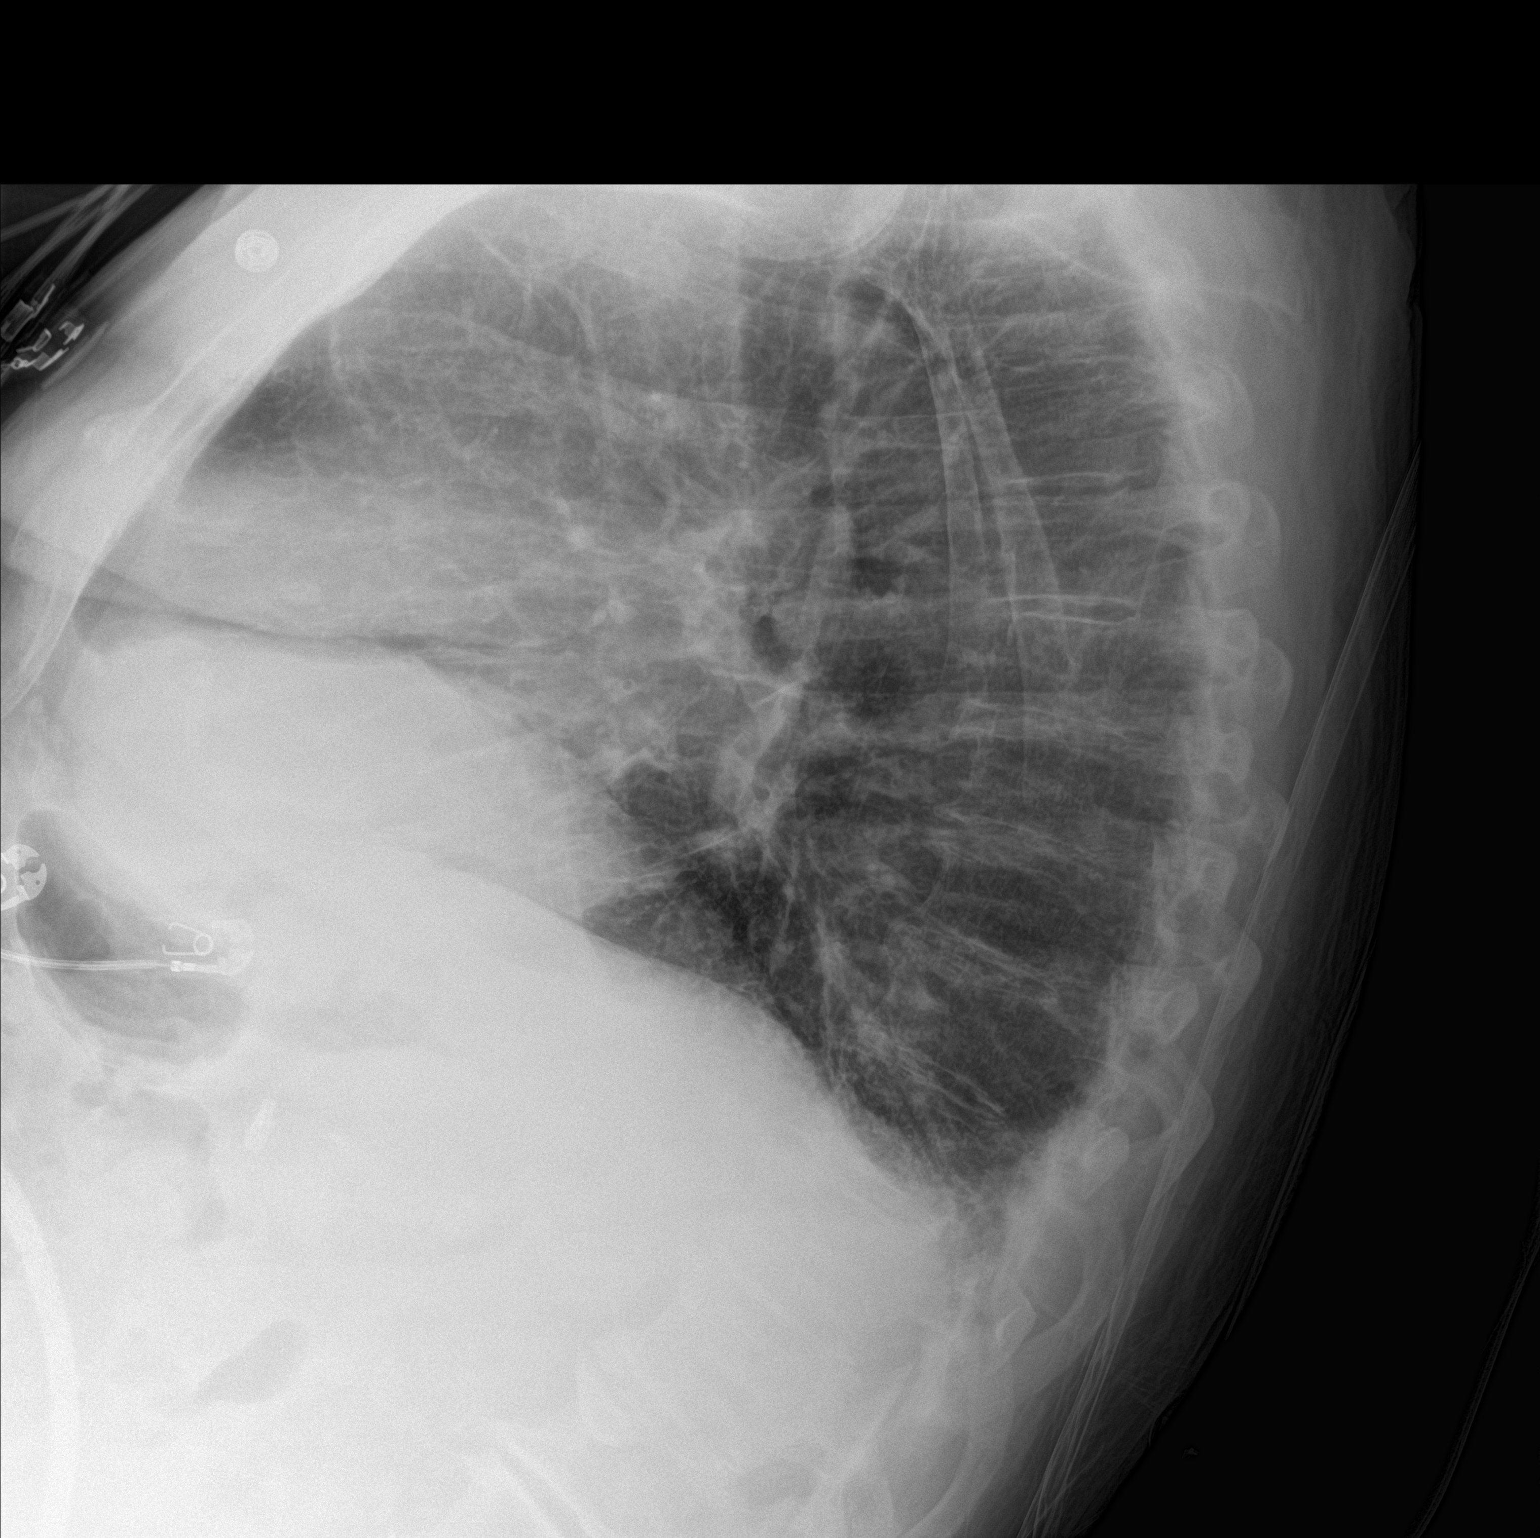

[chest ap]
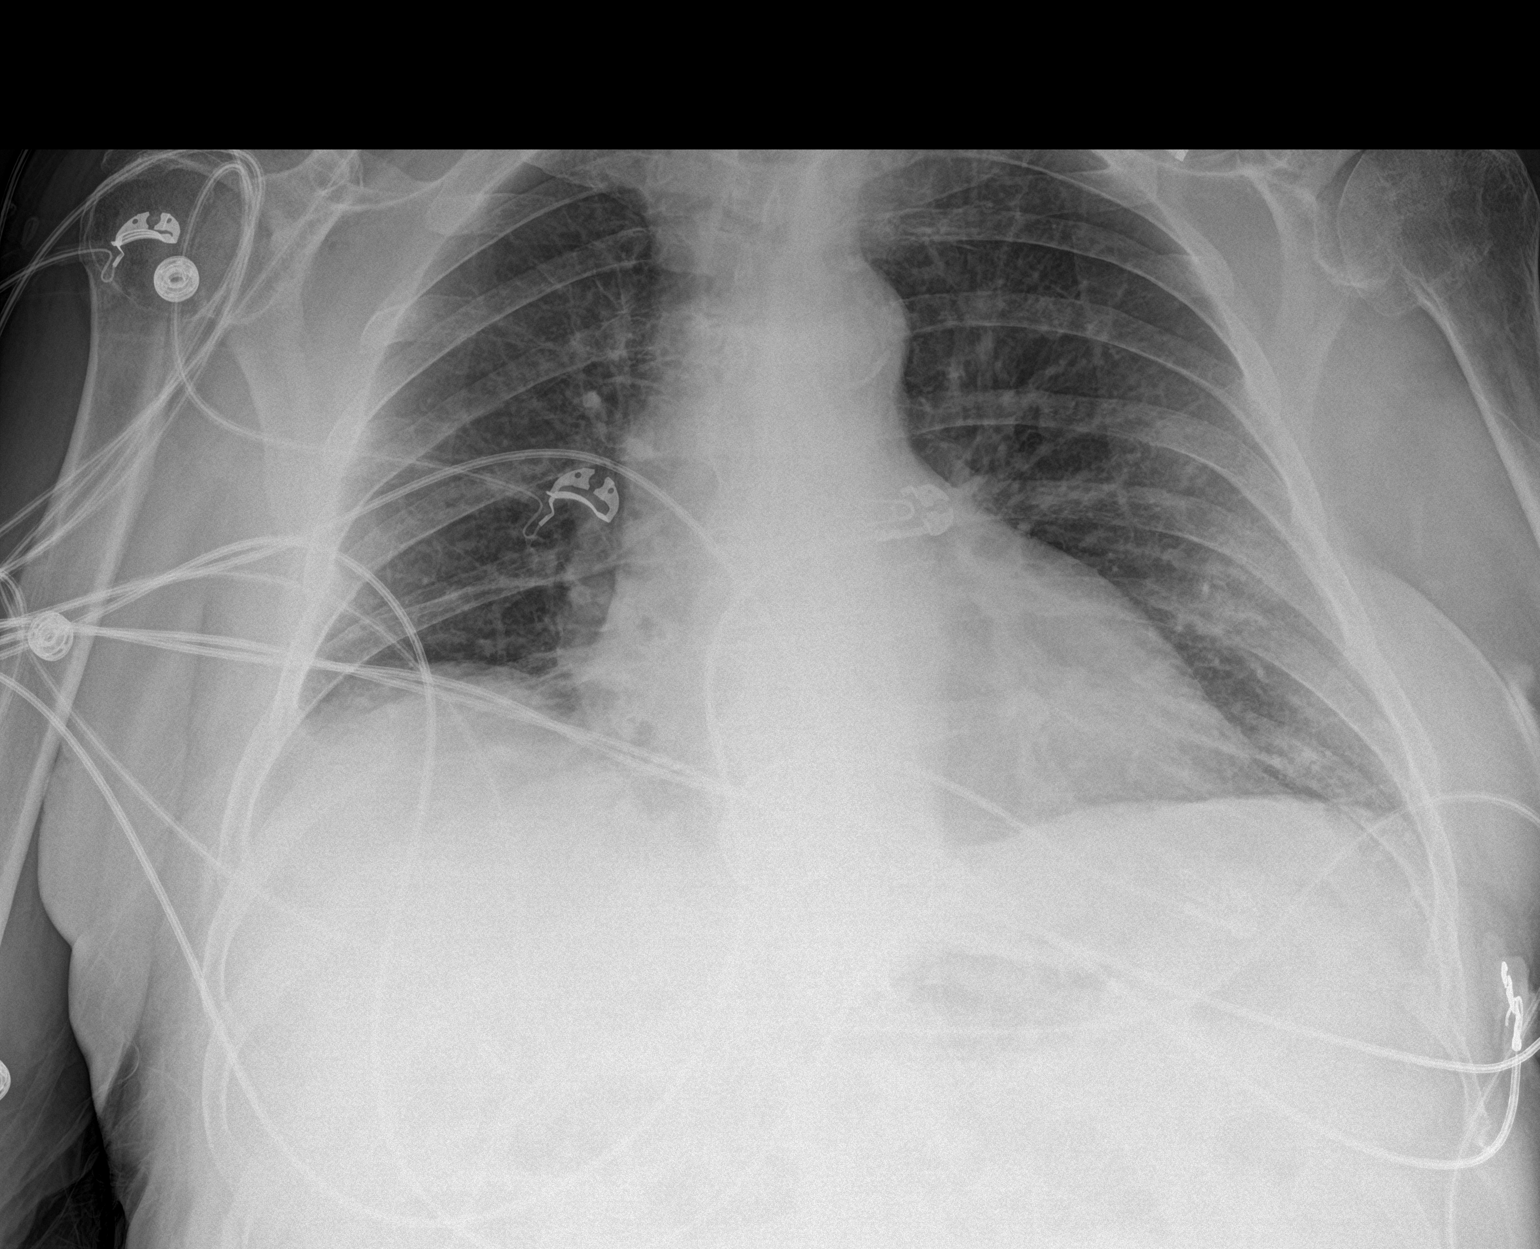

[2 of 2 positions shown; findings below may reference images not displayed]

FINDINGS: The heart size and mediastinal contours are within normal limits.
Chronic slight accentuation of the interstitial markings at the lung
bases. Chronic slight elevation of the anterior aspect of the right
hemidiaphragm.

Calcification in the arch of the aorta. The visualized skeletal
structures are unremarkable.
IMPRESSION: No acute abnormality.

Aortic atherosclerosis.
